# Patient Record
Sex: Male | Born: 1970
Health system: Southern US, Community
[De-identification: ages and names within clinical notes are randomized; demographics above are authoritative.]

## PROBLEM LIST (undated history)

## (undated) DIAGNOSIS — I10 Essential (primary) hypertension: Secondary | ICD-10-CM

## (undated) DIAGNOSIS — E785 Hyperlipidemia, unspecified: Secondary | ICD-10-CM

## (undated) HISTORY — DX: Hyperlipidemia, unspecified: E78.5

## (undated) HISTORY — PX: EYE SURGERY: SHX253

## (undated) HISTORY — PX: VASECTOMY: SHX75

## (undated) HISTORY — DX: Essential (primary) hypertension: I10

---

## 2015-08-20 ENCOUNTER — Other Ambulatory Visit: Payer: Self-pay | Admitting: Family Medicine

## 2015-08-20 DIAGNOSIS — R1084 Generalized abdominal pain: Secondary | ICD-10-CM

## 2015-08-21 ENCOUNTER — Other Ambulatory Visit: Payer: Self-pay | Admitting: Family Medicine

## 2015-08-21 DIAGNOSIS — R1011 Right upper quadrant pain: Secondary | ICD-10-CM

## 2015-08-27 ENCOUNTER — Other Ambulatory Visit: Payer: Self-pay | Admitting: Family Medicine

## 2015-08-27 ENCOUNTER — Ambulatory Visit
Admission: RE | Admit: 2015-08-27 | Discharge: 2015-08-27 | Disposition: A | Discharge status: Home / Self Care | Payer: Self-pay | Source: Ambulatory Visit | Attending: Family Medicine | Admitting: Family Medicine

## 2015-08-27 ENCOUNTER — Ambulatory Visit
Admission: RE | Admit: 2015-08-27 | Discharge: 2015-08-27 | Disposition: A | Discharge status: Home / Self Care | Payer: Federal, State, Local not specified - PPO | Source: Ambulatory Visit | Attending: Family Medicine | Admitting: Family Medicine

## 2015-08-27 DIAGNOSIS — R1011 Right upper quadrant pain: Secondary | ICD-10-CM

## 2015-08-27 DIAGNOSIS — R109 Unspecified abdominal pain: Secondary | ICD-10-CM

## 2016-03-22 DIAGNOSIS — K080 Exfoliation of teeth due to systemic causes: Secondary | ICD-10-CM | POA: Diagnosis not present

## 2016-03-29 DIAGNOSIS — K080 Exfoliation of teeth due to systemic causes: Secondary | ICD-10-CM | POA: Diagnosis not present

## 2016-06-05 IMAGING — US US ABDOMEN COMPLETE
1 series · 14 of 25 positions shown · non-contrast
Comparison: None.

CLINICAL DATA: Right upper quadrant abdominal pain.

EXAM:
ABDOMEN ULTRASOUND COMPLETE

[Series 1: us abdomen complete · 0.30mm/px · 14 of 85 slices shown]
[im 1/85]
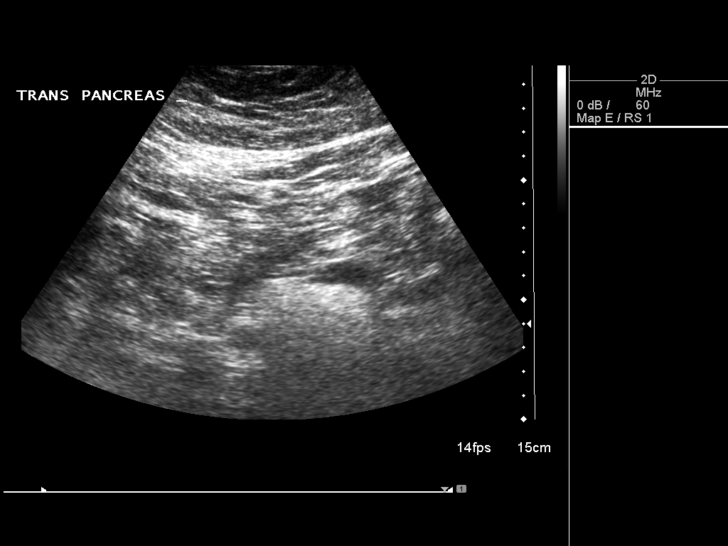
[im 8/85]
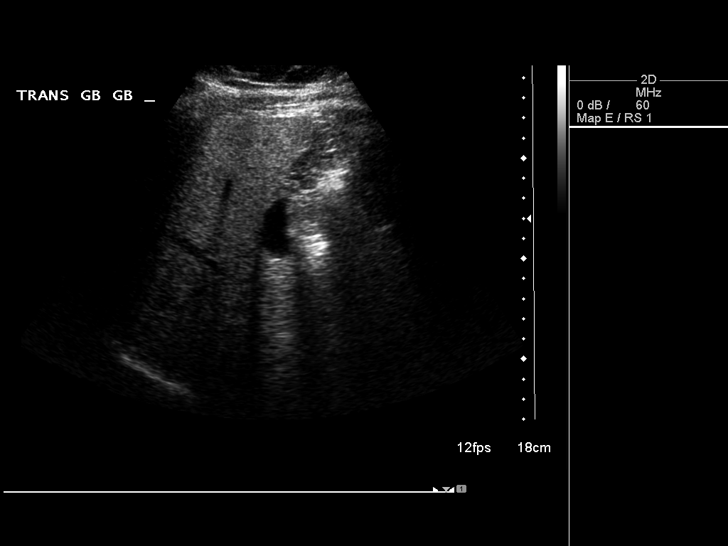
[im 15/85]
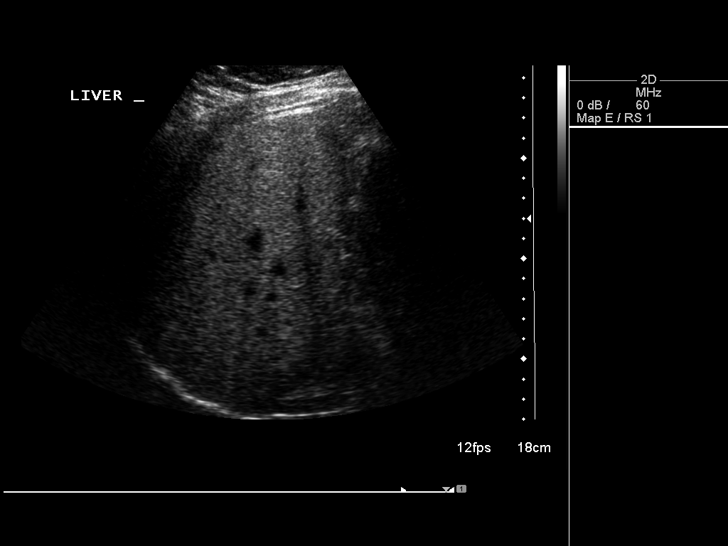
[im 22/85]
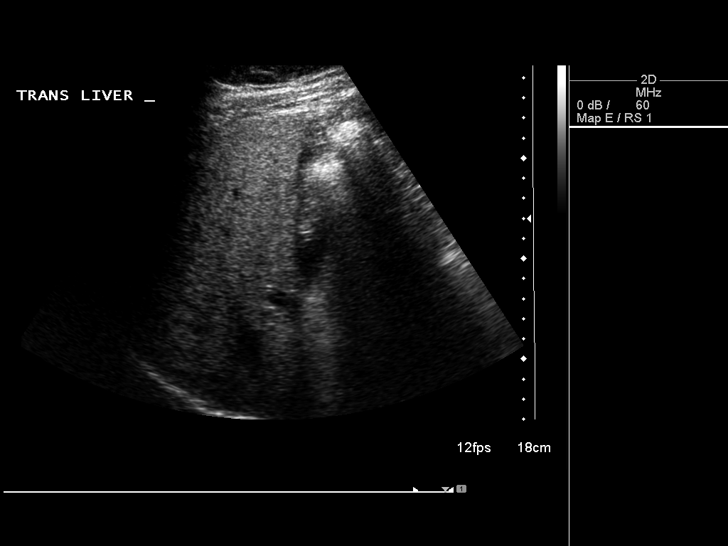
[im 29/85]
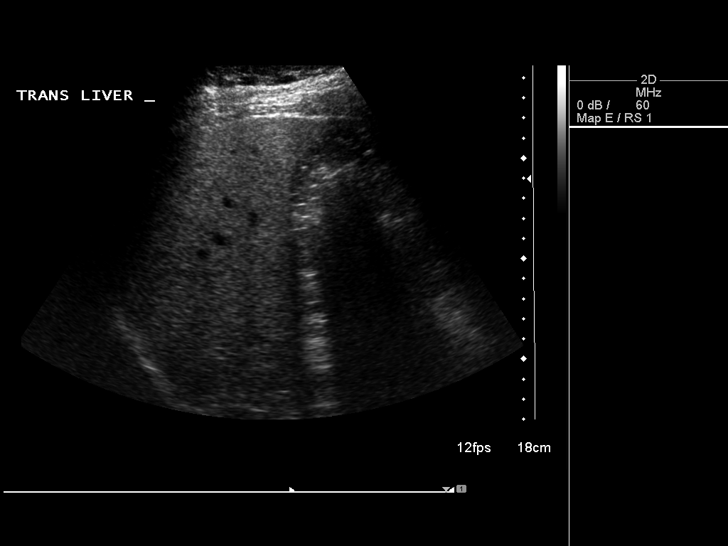
[im 32/85]
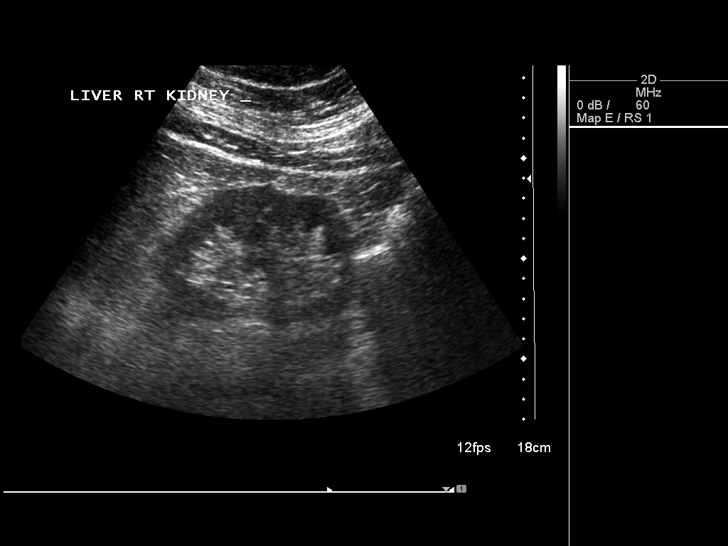
[im 39/85]
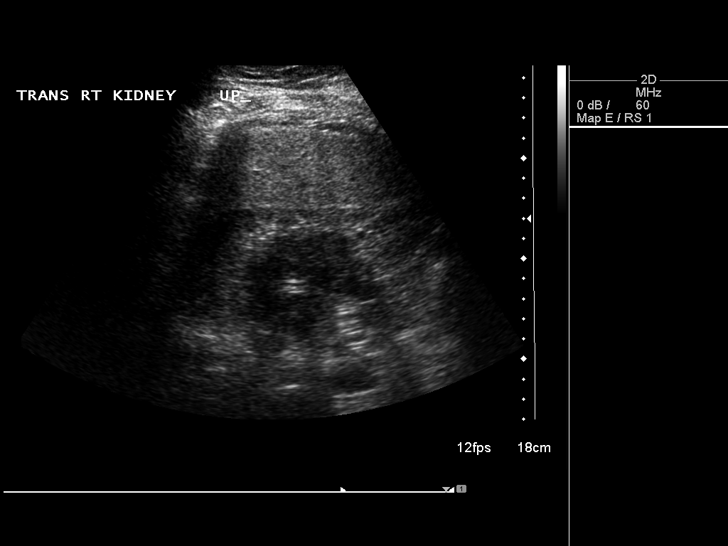
[im 46/85]
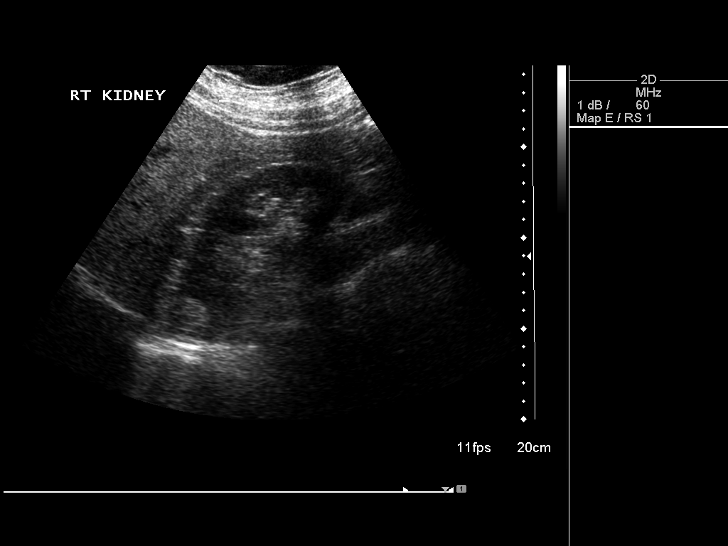
[im 53/85]
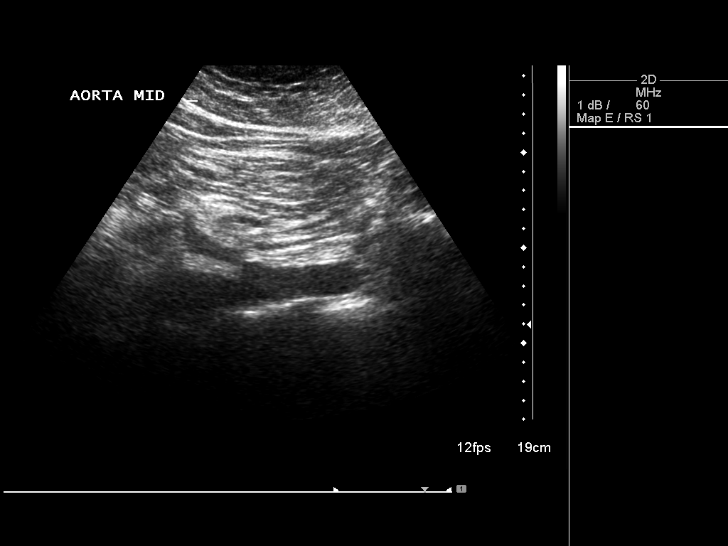
[im 57/85]
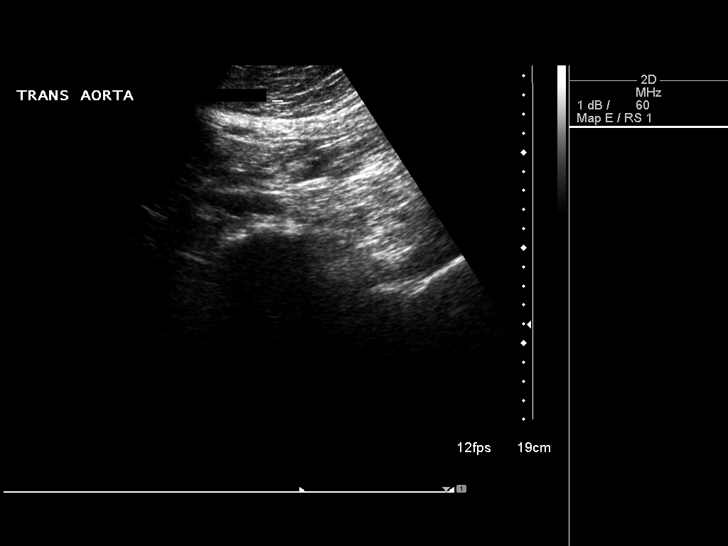
[im 64/85]
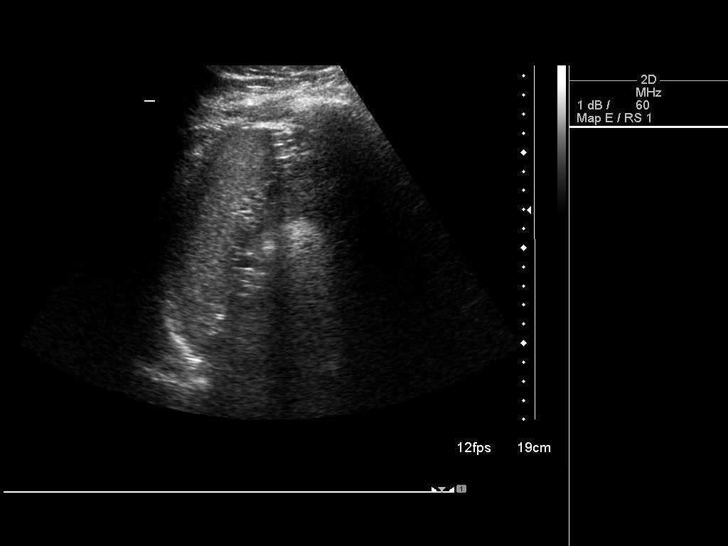
[im 71/85]
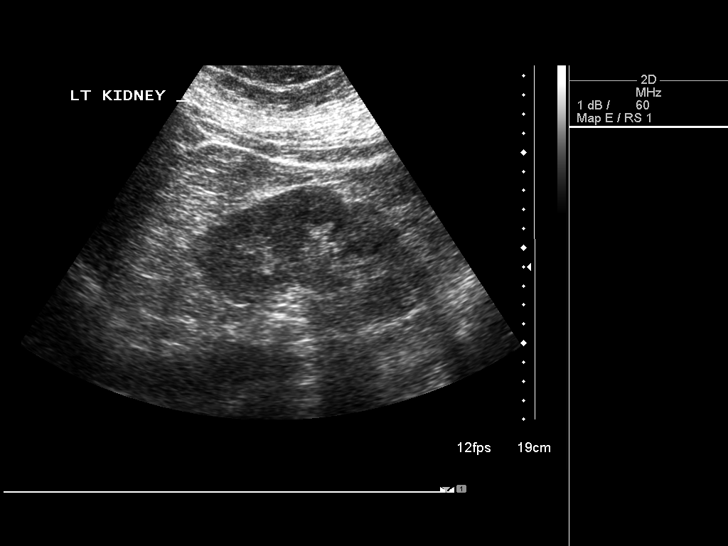
[im 78/85]
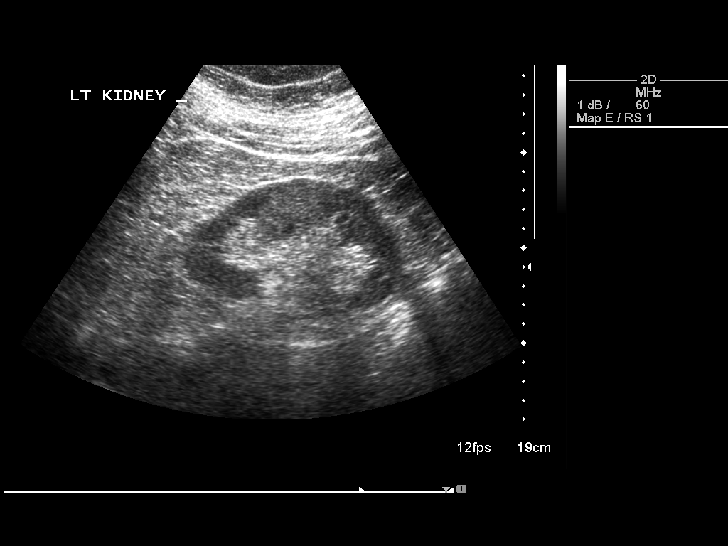
[im 85/85]
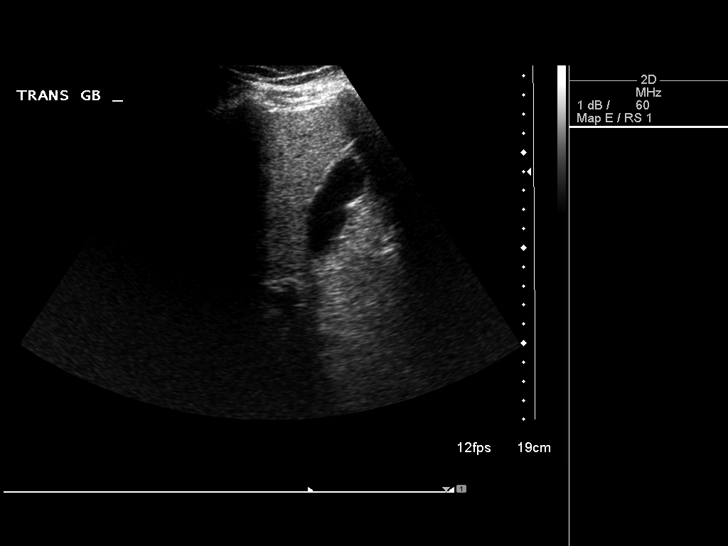

[14 of 25 positions shown; findings below may reference images not displayed]

FINDINGS: Gallbladder: No gallstones or wall thickening visualized. No
sonographic Murphy sign noted by sonographer.

Common bile duct: Diameter: 3 mm which is within normal limits.

Liver: No focal lesion identified. Within normal limits in
parenchymal echogenicity.

IVC: No abnormality visualized.

Pancreas: Visualized portion unremarkable.

Spleen: Size and appearance within normal limits.

Right Kidney: Length: 11.3 cm. Echogenicity within normal limits. No
mass or hydronephrosis visualized.

Left Kidney: Length: 12.2 cm. Echogenicity within normal limits. No
mass or hydronephrosis visualized.

Abdominal aorta: No aneurysm visualized.

Other findings: None.
IMPRESSION: No abnormality seen in the abdomen.

## 2016-07-27 DIAGNOSIS — E739 Lactose intolerance, unspecified: Secondary | ICD-10-CM | POA: Diagnosis not present

## 2016-07-27 DIAGNOSIS — E782 Mixed hyperlipidemia: Secondary | ICD-10-CM | POA: Diagnosis not present

## 2016-07-27 DIAGNOSIS — Z6834 Body mass index (BMI) 34.0-34.9, adult: Secondary | ICD-10-CM | POA: Diagnosis not present

## 2016-07-27 DIAGNOSIS — J309 Allergic rhinitis, unspecified: Secondary | ICD-10-CM | POA: Diagnosis not present

## 2016-09-20 DIAGNOSIS — K080 Exfoliation of teeth due to systemic causes: Secondary | ICD-10-CM | POA: Diagnosis not present

## 2017-02-07 DIAGNOSIS — Z114 Encounter for screening for human immunodeficiency virus [HIV]: Secondary | ICD-10-CM | POA: Diagnosis not present

## 2017-02-07 DIAGNOSIS — Z125 Encounter for screening for malignant neoplasm of prostate: Secondary | ICD-10-CM | POA: Diagnosis not present

## 2017-02-07 DIAGNOSIS — Z1329 Encounter for screening for other suspected endocrine disorder: Secondary | ICD-10-CM | POA: Diagnosis not present

## 2017-02-07 DIAGNOSIS — Z0000 Encounter for general adult medical examination without abnormal findings: Secondary | ICD-10-CM | POA: Diagnosis not present

## 2017-02-07 DIAGNOSIS — Z13220 Encounter for screening for lipoid disorders: Secondary | ICD-10-CM | POA: Diagnosis not present

## 2017-02-09 DIAGNOSIS — Z6834 Body mass index (BMI) 34.0-34.9, adult: Secondary | ICD-10-CM | POA: Diagnosis not present

## 2017-02-09 DIAGNOSIS — Z0000 Encounter for general adult medical examination without abnormal findings: Secondary | ICD-10-CM | POA: Diagnosis not present

## 2017-03-03 DIAGNOSIS — Z23 Encounter for immunization: Secondary | ICD-10-CM | POA: Diagnosis not present

## 2017-03-23 DIAGNOSIS — K080 Exfoliation of teeth due to systemic causes: Secondary | ICD-10-CM | POA: Diagnosis not present

## 2017-04-04 DIAGNOSIS — E782 Mixed hyperlipidemia: Secondary | ICD-10-CM | POA: Diagnosis not present

## 2017-04-04 DIAGNOSIS — Z6834 Body mass index (BMI) 34.0-34.9, adult: Secondary | ICD-10-CM | POA: Diagnosis not present

## 2017-04-04 DIAGNOSIS — I10 Essential (primary) hypertension: Secondary | ICD-10-CM | POA: Diagnosis not present

## 2017-04-25 DIAGNOSIS — I10 Essential (primary) hypertension: Secondary | ICD-10-CM | POA: Diagnosis not present

## 2017-08-08 DIAGNOSIS — E782 Mixed hyperlipidemia: Secondary | ICD-10-CM | POA: Diagnosis not present

## 2017-08-08 DIAGNOSIS — I10 Essential (primary) hypertension: Secondary | ICD-10-CM | POA: Diagnosis not present

## 2017-08-08 DIAGNOSIS — R945 Abnormal results of liver function studies: Secondary | ICD-10-CM | POA: Diagnosis not present

## 2017-08-10 DIAGNOSIS — I10 Essential (primary) hypertension: Secondary | ICD-10-CM | POA: Diagnosis not present

## 2017-08-10 DIAGNOSIS — E782 Mixed hyperlipidemia: Secondary | ICD-10-CM | POA: Diagnosis not present

## 2017-08-10 DIAGNOSIS — R945 Abnormal results of liver function studies: Secondary | ICD-10-CM | POA: Diagnosis not present

## 2017-08-10 DIAGNOSIS — L0202 Furuncle of face: Secondary | ICD-10-CM | POA: Diagnosis not present

## 2017-09-27 DIAGNOSIS — K080 Exfoliation of teeth due to systemic causes: Secondary | ICD-10-CM | POA: Diagnosis not present

## 2017-10-06 DIAGNOSIS — D179 Benign lipomatous neoplasm, unspecified: Secondary | ICD-10-CM | POA: Diagnosis not present

## 2017-10-06 DIAGNOSIS — Z6834 Body mass index (BMI) 34.0-34.9, adult: Secondary | ICD-10-CM | POA: Diagnosis not present

## 2018-02-14 DIAGNOSIS — Z125 Encounter for screening for malignant neoplasm of prostate: Secondary | ICD-10-CM | POA: Diagnosis not present

## 2018-02-14 DIAGNOSIS — Z0000 Encounter for general adult medical examination without abnormal findings: Secondary | ICD-10-CM | POA: Diagnosis not present

## 2018-02-14 DIAGNOSIS — Z1329 Encounter for screening for other suspected endocrine disorder: Secondary | ICD-10-CM | POA: Diagnosis not present

## 2018-02-14 DIAGNOSIS — Z114 Encounter for screening for human immunodeficiency virus [HIV]: Secondary | ICD-10-CM | POA: Diagnosis not present

## 2018-02-14 DIAGNOSIS — E782 Mixed hyperlipidemia: Secondary | ICD-10-CM | POA: Diagnosis not present

## 2018-02-16 DIAGNOSIS — I10 Essential (primary) hypertension: Secondary | ICD-10-CM | POA: Diagnosis not present

## 2018-02-16 DIAGNOSIS — L821 Other seborrheic keratosis: Secondary | ICD-10-CM | POA: Diagnosis not present

## 2018-02-16 DIAGNOSIS — Z0000 Encounter for general adult medical examination without abnormal findings: Secondary | ICD-10-CM | POA: Diagnosis not present

## 2018-02-16 DIAGNOSIS — D179 Benign lipomatous neoplasm, unspecified: Secondary | ICD-10-CM | POA: Diagnosis not present

## 2018-02-16 DIAGNOSIS — E782 Mixed hyperlipidemia: Secondary | ICD-10-CM | POA: Diagnosis not present

## 2018-02-16 DIAGNOSIS — Z23 Encounter for immunization: Secondary | ICD-10-CM | POA: Diagnosis not present

## 2018-03-09 DIAGNOSIS — D485 Neoplasm of uncertain behavior of skin: Secondary | ICD-10-CM | POA: Diagnosis not present

## 2018-03-09 DIAGNOSIS — D23121 Other benign neoplasm of skin of left upper eyelid, including canthus: Secondary | ICD-10-CM | POA: Diagnosis not present

## 2018-03-13 DIAGNOSIS — Z114 Encounter for screening for human immunodeficiency virus [HIV]: Secondary | ICD-10-CM | POA: Diagnosis not present

## 2018-03-13 DIAGNOSIS — Z0000 Encounter for general adult medical examination without abnormal findings: Secondary | ICD-10-CM | POA: Diagnosis not present

## 2018-03-13 DIAGNOSIS — Z1329 Encounter for screening for other suspected endocrine disorder: Secondary | ICD-10-CM | POA: Diagnosis not present

## 2018-04-03 DIAGNOSIS — K080 Exfoliation of teeth due to systemic causes: Secondary | ICD-10-CM | POA: Diagnosis not present

## 2018-04-25 DIAGNOSIS — H018 Other specified inflammations of eyelid: Secondary | ICD-10-CM | POA: Diagnosis not present

## 2018-04-25 DIAGNOSIS — D485 Neoplasm of uncertain behavior of skin: Secondary | ICD-10-CM | POA: Diagnosis not present

## 2018-05-10 DIAGNOSIS — K080 Exfoliation of teeth due to systemic causes: Secondary | ICD-10-CM | POA: Diagnosis not present

## 2018-06-19 DIAGNOSIS — Z6833 Body mass index (BMI) 33.0-33.9, adult: Secondary | ICD-10-CM | POA: Diagnosis not present

## 2018-06-19 DIAGNOSIS — I10 Essential (primary) hypertension: Secondary | ICD-10-CM | POA: Diagnosis not present

## 2018-12-18 DIAGNOSIS — I10 Essential (primary) hypertension: Secondary | ICD-10-CM | POA: Diagnosis not present

## 2019-01-08 DIAGNOSIS — I10 Essential (primary) hypertension: Secondary | ICD-10-CM | POA: Diagnosis not present

## 2019-01-08 DIAGNOSIS — Z719 Counseling, unspecified: Secondary | ICD-10-CM | POA: Diagnosis not present

## 2019-01-08 DIAGNOSIS — M7542 Impingement syndrome of left shoulder: Secondary | ICD-10-CM | POA: Diagnosis not present

## 2019-02-21 ENCOUNTER — Other Ambulatory Visit: Payer: Self-pay | Admitting: Family Medicine

## 2019-02-21 ENCOUNTER — Other Ambulatory Visit: Payer: Self-pay

## 2019-02-21 DIAGNOSIS — R197 Diarrhea, unspecified: Secondary | ICD-10-CM | POA: Diagnosis not present

## 2019-02-21 DIAGNOSIS — Z20822 Contact with and (suspected) exposure to covid-19: Secondary | ICD-10-CM

## 2019-02-21 DIAGNOSIS — R221 Localized swelling, mass and lump, neck: Secondary | ICD-10-CM

## 2019-02-21 DIAGNOSIS — R6889 Other general symptoms and signs: Secondary | ICD-10-CM | POA: Diagnosis not present

## 2019-02-21 DIAGNOSIS — I10 Essential (primary) hypertension: Secondary | ICD-10-CM | POA: Diagnosis not present

## 2019-02-21 DIAGNOSIS — M7542 Impingement syndrome of left shoulder: Secondary | ICD-10-CM | POA: Diagnosis not present

## 2019-02-22 ENCOUNTER — Ambulatory Visit
Admission: RE | Admit: 2019-02-22 | Discharge: 2019-02-22 | Disposition: A | Discharge status: Home / Self Care | Payer: Federal, State, Local not specified - PPO | Source: Ambulatory Visit | Attending: Family Medicine | Admitting: Family Medicine

## 2019-02-22 DIAGNOSIS — R221 Localized swelling, mass and lump, neck: Secondary | ICD-10-CM

## 2019-02-23 LAB — NOVEL CORONAVIRUS, NAA: SARS-CoV-2, NAA: NOT DETECTED

## 2019-02-26 DIAGNOSIS — D179 Benign lipomatous neoplasm, unspecified: Secondary | ICD-10-CM | POA: Diagnosis not present

## 2019-02-26 DIAGNOSIS — R197 Diarrhea, unspecified: Secondary | ICD-10-CM | POA: Diagnosis not present

## 2019-02-26 DIAGNOSIS — Z23 Encounter for immunization: Secondary | ICD-10-CM | POA: Diagnosis not present

## 2019-03-07 DIAGNOSIS — Z23 Encounter for immunization: Secondary | ICD-10-CM | POA: Diagnosis not present

## 2019-04-13 DIAGNOSIS — Z125 Encounter for screening for malignant neoplasm of prostate: Secondary | ICD-10-CM | POA: Diagnosis not present

## 2019-04-13 DIAGNOSIS — Z0000 Encounter for general adult medical examination without abnormal findings: Secondary | ICD-10-CM | POA: Diagnosis not present

## 2019-04-13 DIAGNOSIS — E782 Mixed hyperlipidemia: Secondary | ICD-10-CM | POA: Diagnosis not present

## 2019-04-13 LAB — LIPID PANEL
Cholesterol: 137 (ref 0–200)
HDL: 34 — AB (ref 35–70)
LDL Cholesterol: 73
Triglycerides: 175 — AB (ref 40–160)

## 2019-04-13 LAB — CBC AND DIFFERENTIAL: Hemoglobin: 16.4 (ref 13.5–17.5)

## 2019-04-13 LAB — BASIC METABOLIC PANEL: Creatinine: 0.9 (ref <=1.3)

## 2019-04-13 LAB — HEPATIC FUNCTION PANEL
ALT: 39 (ref 10–40)
AST: 31 (ref 14–40)
Alkaline Phosphatase: 76 (ref 25–125)

## 2019-04-17 DIAGNOSIS — Z6833 Body mass index (BMI) 33.0-33.9, adult: Secondary | ICD-10-CM | POA: Diagnosis not present

## 2019-04-17 DIAGNOSIS — Z0000 Encounter for general adult medical examination without abnormal findings: Secondary | ICD-10-CM | POA: Diagnosis not present

## 2019-04-17 DIAGNOSIS — Z1211 Encounter for screening for malignant neoplasm of colon: Secondary | ICD-10-CM | POA: Diagnosis not present

## 2019-10-16 DIAGNOSIS — H35033 Hypertensive retinopathy, bilateral: Secondary | ICD-10-CM | POA: Diagnosis not present

## 2019-12-02 IMAGING — US US SOFT TISSUE HEAD/NECK
1 series · 8 of 8 positions shown · non-contrast
Comparison: None.

CLINICAL DATA: Palpable left lateral neck/shoulder soft tissue mass

EXAM:
ULTRASOUND OF HEAD/NECK SOFT TISSUES
TECHNIQUE: Ultrasound examination of the head and neck soft tissues was
performed in the area of clinical concern.

[Series 1: us soft tissue head/neck · 0.06mm/px · 8 acquisitions, 8 frames shown]
[im 1/8]
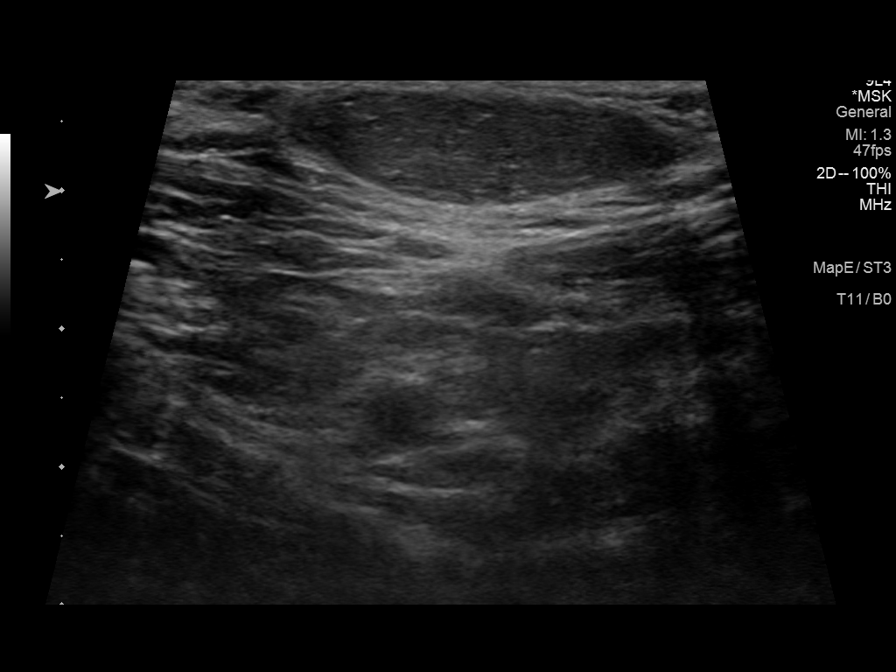
[im 2/8]
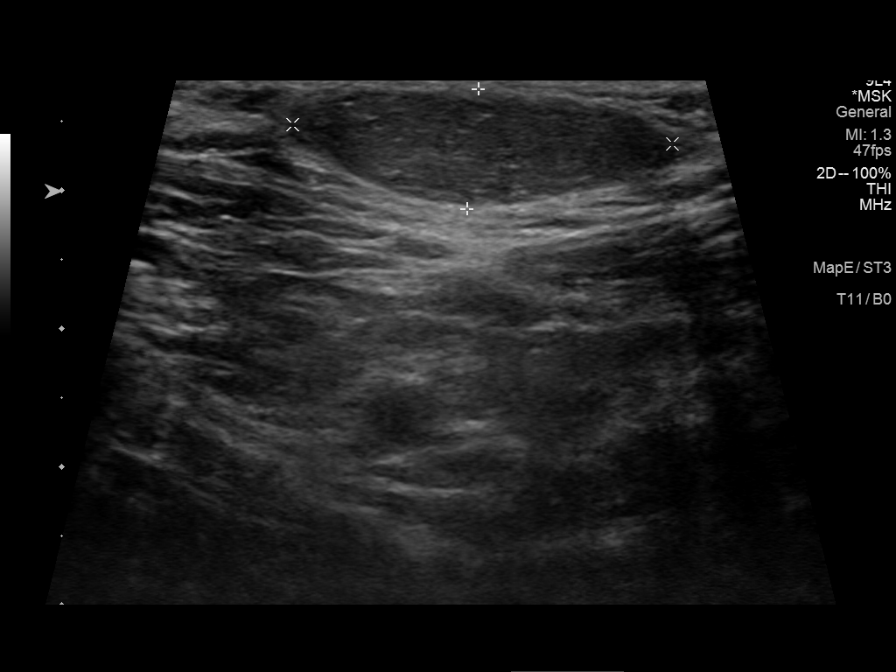
[im 3/8]
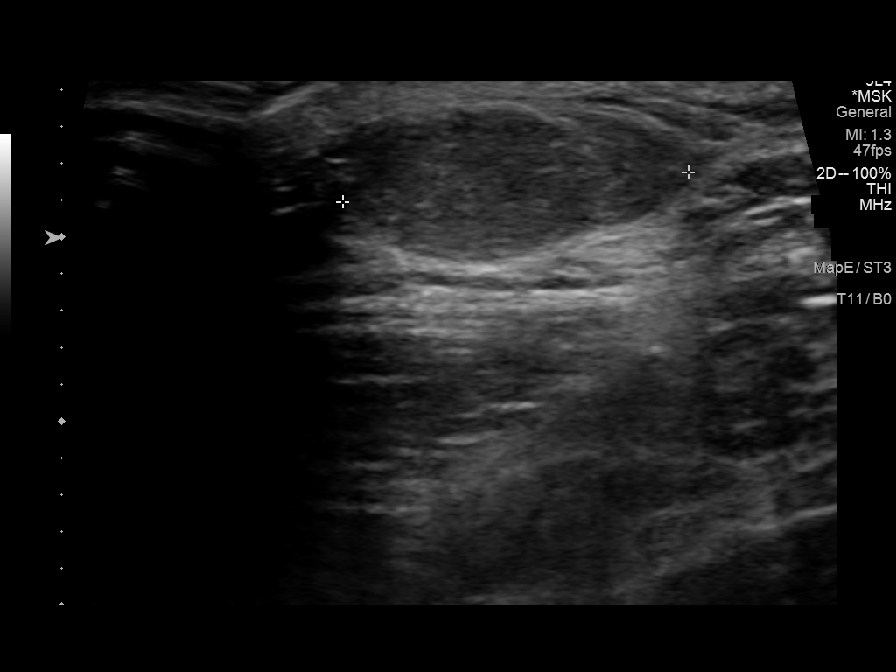
[im 4/8]
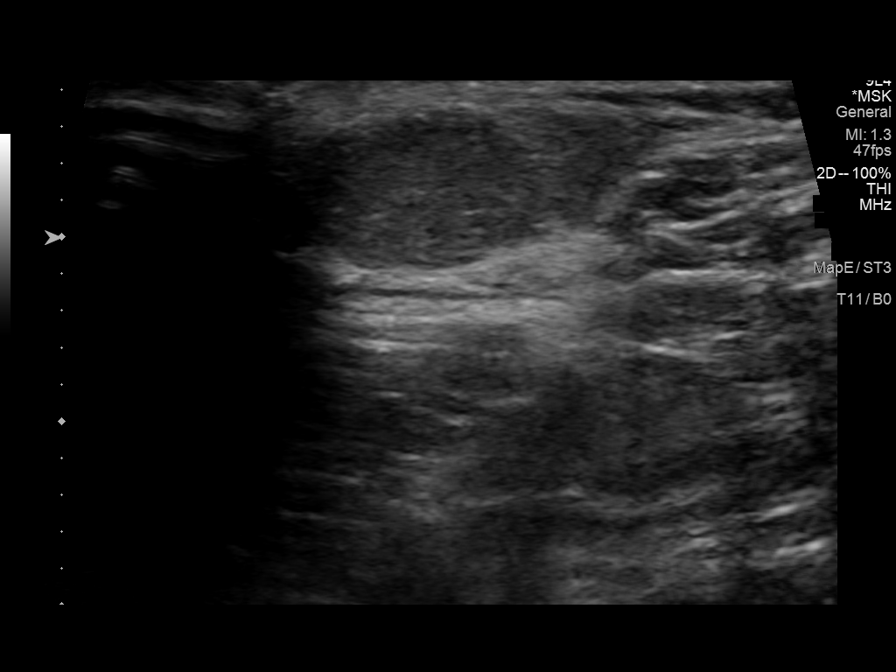
[im 5/8]
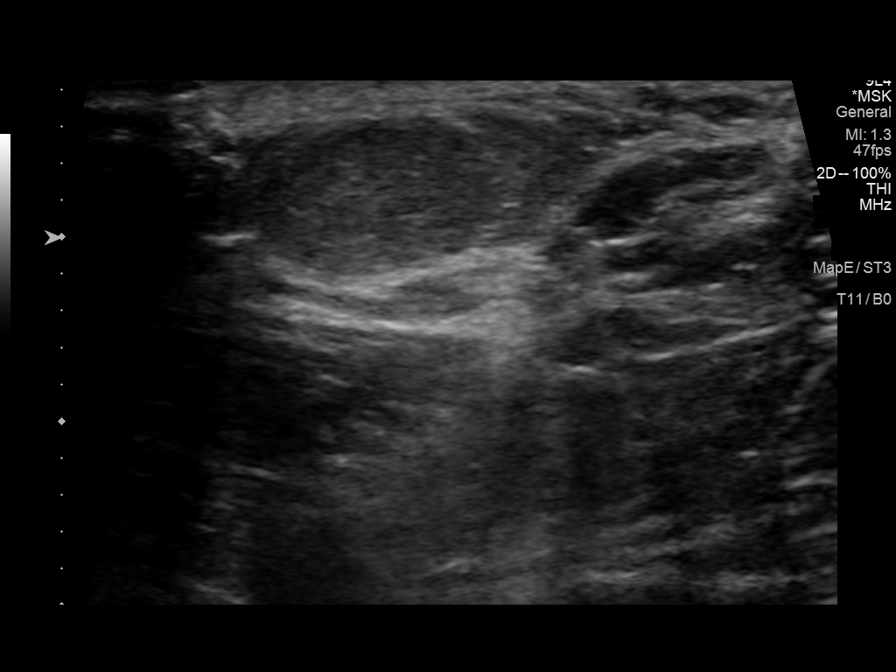
[im 6/8]
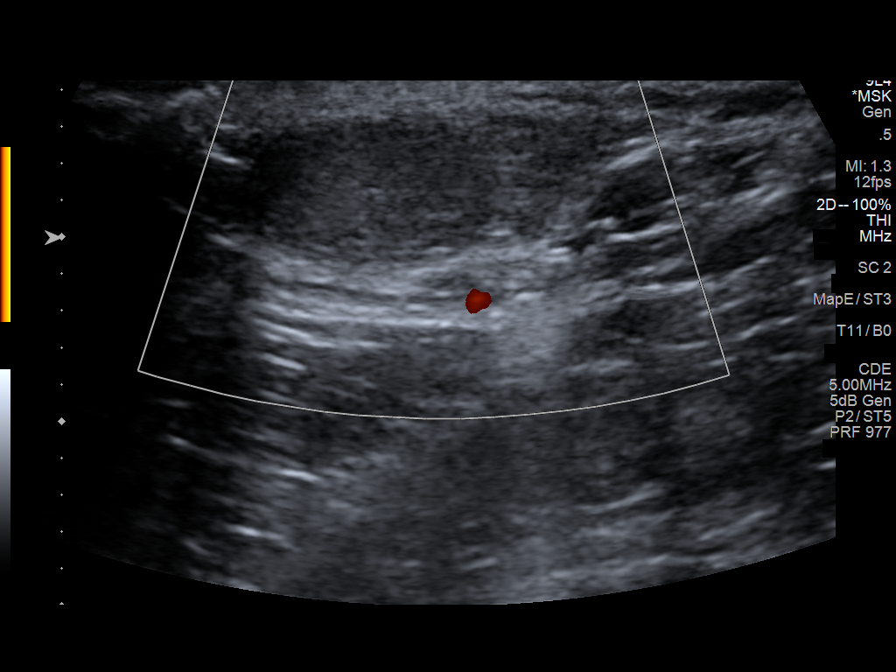
[im 7/8]
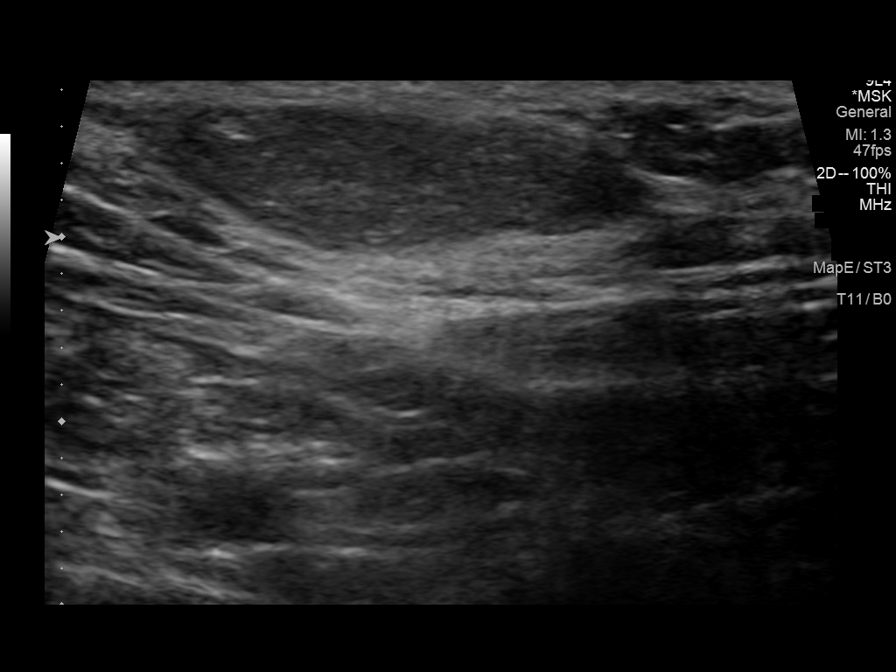
[im 8/8]
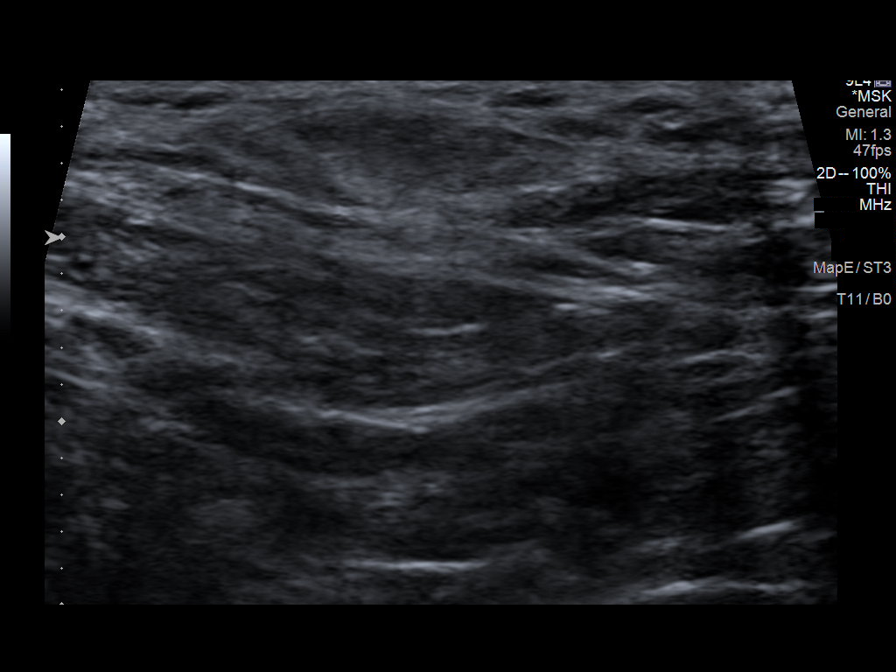

[8 of 8 positions shown; findings below may reference images not displayed]

FINDINGS: Soft tissue ultrasound performed of the left lateral neck/shoulder
area of concern. This correlates with a superficial oval hypoechoic
soft tissue mass measuring 1.9 x 2.8 x 0.9 cm. No associated
significant vascularity. These remain nonspecific by ultrasound but
favored to represent soft tissue lipoma.
IMPRESSION: Left lateral neck/shoulder superficial homogeneous hypoechoic soft
tissue mass. Lipoma is favored.

## 2019-12-21 DIAGNOSIS — H40023 Open angle with borderline findings, high risk, bilateral: Secondary | ICD-10-CM | POA: Diagnosis not present

## 2020-01-04 ENCOUNTER — Ambulatory Visit (INDEPENDENT_AMBULATORY_CARE_PROVIDER_SITE_OTHER): Payer: Federal, State, Local not specified - PPO | Admitting: Physician Assistant

## 2020-01-04 ENCOUNTER — Encounter: Payer: Self-pay | Admitting: Physician Assistant

## 2020-01-04 ENCOUNTER — Other Ambulatory Visit: Payer: Self-pay

## 2020-01-04 VITALS — BP 110/84 | HR 83 | Temp 98.1°F | Resp 16 | Ht 75.0 in | Wt 272.0 lb

## 2020-01-04 DIAGNOSIS — N50812 Left testicular pain: Secondary | ICD-10-CM | POA: Diagnosis not present

## 2020-01-04 DIAGNOSIS — E785 Hyperlipidemia, unspecified: Secondary | ICD-10-CM

## 2020-01-04 DIAGNOSIS — I10 Essential (primary) hypertension: Secondary | ICD-10-CM

## 2020-01-04 LAB — COMPREHENSIVE METABOLIC PANEL
ALT: 52 U/L (ref 0–53)
AST: 34 U/L (ref 0–37)
Albumin: 4.9 g/dL (ref 3.5–5.2)
Alkaline Phosphatase: 70 U/L (ref 39–117)
BUN: 15 mg/dL (ref 6–23)
CO2: 30 mEq/L (ref 19–32)
Calcium: 9.5 mg/dL (ref 8.4–10.5)
Chloride: 105 mEq/L (ref 96–112)
Creatinine, Ser: 1.02 mg/dL (ref 0.40–1.50)
GFR: 77.69 mL/min (ref >60.00)
Glucose, Bld: 96 mg/dL (ref 70–99)
Potassium: 3.7 mEq/L (ref 3.5–5.1)
Sodium: 143 mEq/L (ref 135–145)
Total Bilirubin: 0.6 mg/dL (ref 0.2–1.2)
Total Protein: 7.2 g/dL (ref 6.0–8.3)

## 2020-01-04 LAB — LIPID PANEL
Cholesterol: 139 mg/dL (ref 0–200)
HDL: 31.3 mg/dL — ABNORMAL LOW (ref >39.00)
LDL Cholesterol: 68 mg/dL (ref 0–99)
NonHDL: 107.55
Total CHOL/HDL Ratio: 4
Triglycerides: 198 mg/dL — ABNORMAL HIGH (ref 0.0–149.0)
VLDL: 39.6 mg/dL (ref 0.0–40.0)

## 2020-01-04 NOTE — Progress Notes (Signed)
Patient presents to clinic today to establish care. Patient is transferring care from Dr. Ernie Hew. Records requested. Endorses last CPE around 04/2019.   Acute Concerns: Intermittent L testicular pain x 1-2 years s/p vasectomy.  Denies any noted swelling of the area. Denies noted skin changes.  No symptoms of R testicle.  Denies change to urination.  Chronic Issues: Hypertension -- Patient is currently on a regimen of Benicar-HCT 40-12.5 mg daily. Endorses taking medications as directed. Patient denies chest pain, palpitations, lightheadedness, dizziness, vision changes or frequent headaches. Does walk a lot for work -- works at the [employer]. Lean proteins with vegetables and fruits. .   BP Readings from Last 3 Encounters:  01/04/20 110/84   Hyperlipidemia -- Patient is currently on Crestor 5 mg daily. Endorses taking medications as directed. Diet as noted previously.   Health Maintenance: Immunizations -- Will obtain records.  Colon Cancer Screening -- FOBT last year. Negative. Agrees to Cologuard testing.   Past Medical History:  Diagnosis Date  . Hyperlipidemia   . Hypertension     Current Outpatient Medications on File Prior to Visit  Medication Sig Dispense Refill  . clindamycin (CLEOCIN T) 1 % lotion Apply topically 2 (two) times daily as needed.    Marland Kitchen olmesartan-hydrochlorothiazide (BENICAR HCT) 40-12.5 MG tablet Take 1 tablet by mouth daily.    . rosuvastatin (CRESTOR) 5 MG tablet Take 5 mg by mouth at bedtime.     No current facility-administered medications on file prior to visit.    No Known Allergies  No family history on file.  Social History   Socioeconomic History  . Marital status: Married    Spouse name: Not on file  . Number of children: Not on file  . Years of education: Not on file  . Highest education level: Not on file  Occupational History  . Not on file  Tobacco Use  . Smoking status: Former Smoker    Types: Cigarettes    Quit date:  2007    Years since quitting: 14.5  . Smokeless tobacco: Never Used  Vaping Use  . Vaping Use: Never used  Substance and Sexual Activity  . Alcohol use: Yes    Alcohol/week: 2.0 standard drinks    Types: 2 Cans of beer per week  . Drug use: Not Currently  . Sexual activity: Yes  Other Topics Concern  . Not on file  Social History Narrative  . Not on file   Social Determinants of Health   Financial Resource Strain:   . Difficulty of Paying Living Expenses:   Food Insecurity:   . Worried About Charity fundraiser in the Last Year:   . Arboriculturist in the Last Year:   Transportation Needs:   . Film/video editor (Medical):   Marland Kitchen Lack of Transportation (Non-Medical):   Physical Activity:   . Days of Exercise per Week:   . Minutes of Exercise per Session:   Stress:   . Feeling of Stress :   Social Connections:   . Frequency of Communication with Friends and Family:   . Frequency of Social Gatherings with Friends and Family:   . Attends Religious Services:   . Active Member of Clubs or Organizations:   . Attends Archivist Meetings:   Marland Kitchen Marital Status:   Intimate Partner Violence:   . Fear of Current or Ex-Partner:   . Emotionally Abused:   Marland Kitchen Physically Abused:   . Sexually Abused:  ROS Pertinent ROS are listed in the HPI.   BP 110/84   Pulse 83   Temp 98.1 F (36.7 C) (Temporal)   Resp 16   Ht 6' 3" (1.905 m)   Wt (!) 272 lb (123.4 kg)   SpO2 98%   BMI 34.00 kg/m   Physical Exam Vitals reviewed.  Constitutional:      Appearance: Normal appearance.  HENT:     Head: Normocephalic and atraumatic.  Cardiovascular:     Rate and Rhythm: Normal rate and regular rhythm.  Abdominal:     Hernia: There is no hernia in the left inguinal area or right inguinal area.  Genitourinary:    Penis: Normal and circumcised. No tenderness or swelling.      Testes: Normal.        Right: Mass, tenderness or swelling not present.        Left: Mass, tenderness  or swelling not present.     Epididymis:     Right: Normal.     Left: Normal.  Musculoskeletal:     Cervical back: Neck supple.  Neurological:     Mental Status: He is alert.     Assessment/Plan: 1. Essential hypertension BP stable. Asymptomatic. Continue current regimen. Will check metabolic panel today.  - Comp Met (CMET)  2. Hyperlipidemia, unspecified hyperlipidemia type Taking statin as directed. Dietary and exercise recommendations reviewed. Will obtain fasting lipid and CMP today. - Comp Met (CMET) - Lipid panel  3. Left testicular pain Intermittent episodes since Vasectomy. No abnormalities on examination. Potentially pulling of scar tissue. Discussed potential for imaging to further assess. Patient would like to monitor for now.   This visit occurred during the SARS-CoV-2 public health emergency.  Safety protocols were in place, including screening questions prior to the visit, additional usage of staff PPE, and extensive cleaning of exam room while observing appropriate contact time as indicated for disinfecting solutions.     Leeanne Rio, PA-C

## 2020-01-04 NOTE — Patient Instructions (Signed)
Please keep up with current medication regimen.  Follow low-salt diet below.  Work up to a goal of 150 minutes of moderate-intensity aerobic exercise per week.  We will follow-up in November for your full physical. Sooner if needed.   Please go to the lab today for blood work.  I will call you with your results. We will alter treatment regimen(s) if indicated by your results.    DASH Eating Plan DASH stands for "Dietary Approaches to Stop Hypertension." The DASH eating plan is a healthy eating plan that has been shown to reduce high blood pressure (hypertension). It may also reduce your risk for type 2 diabetes, heart disease, and stroke. The DASH eating plan may also help with weight loss. What are tips for following this plan?  General guidelines  Avoid eating more than 2,300 mg (milligrams) of salt (sodium) a day. If you have hypertension, you may need to reduce your sodium intake to 1,500 mg a day.  Limit alcohol intake to no more than 1 drink a day for nonpregnant women and 2 drinks a day for men. One drink equals 12 oz of beer, 5 oz of wine, or 1 oz of hard liquor.  Work with your health care provider to maintain a healthy body weight or to lose weight. Ask what an ideal weight is for you.  Get at least 30 minutes of exercise that causes your heart to beat faster (aerobic exercise) most days of the week. Activities may include walking, swimming, or biking.  Work with your health care provider or diet and nutrition specialist (dietitian) to adjust your eating plan to your individual calorie needs. Reading food labels   Check food labels for the amount of sodium per serving. Choose foods with less than 5 percent of the Daily Value of sodium. Generally, foods with less than 300 mg of sodium per serving fit into this eating plan.  To find whole grains, look for the word "whole" as the first word in the ingredient list. Shopping  Buy products labeled as "low-sodium" or "no salt  added."  Buy fresh foods. Avoid canned foods and premade or frozen meals. Cooking  Avoid adding salt when cooking. Use salt-free seasonings or herbs instead of table salt or sea salt. Check with your health care provider or pharmacist before using salt substitutes.  Do not fry foods. Cook foods using healthy methods such as baking, boiling, grilling, and broiling instead.  Cook with heart-healthy oils, such as olive, canola, soybean, or sunflower oil. Meal planning  Eat a balanced diet that includes: ? 5 or more servings of fruits and vegetables each day. At each meal, try to fill half of your plate with fruits and vegetables. ? Up to 6-8 servings of whole grains each day. ? Less than 6 oz of lean meat, poultry, or fish each day. A 3-oz serving of meat is about the same size as a deck of cards. One egg equals 1 oz. ? 2 servings of low-fat dairy each day. ? A serving of nuts, seeds, or beans 5 times each week. ? Heart-healthy fats. Healthy fats called Omega-3 fatty acids are found in foods such as flaxseeds and coldwater fish, like sardines, salmon, and mackerel.  Limit how much you eat of the following: ? Canned or prepackaged foods. ? Food that is high in trans fat, such as fried foods. ? Food that is high in saturated fat, such as fatty meat. ? Sweets, desserts, sugary drinks, and other foods with added sugar. ?  Full-fat dairy products.  Do not salt foods before eating.  Try to eat at least 2 vegetarian meals each week.  Eat more home-cooked food and less restaurant, buffet, and fast food.  When eating at a restaurant, ask that your food be prepared with less salt or no salt, if possible. What foods are recommended? The items listed may not be a complete list. Talk with your dietitian about what dietary choices are best for you. Grains Whole-grain or whole-wheat bread. Whole-grain or whole-wheat pasta. Brown rice. [place]. Bulgur. Whole-grain and low-sodium cereals.  Pita bread. Low-fat, low-sodium crackers. Whole-wheat flour tortillas. Vegetables Fresh or frozen vegetables (raw, steamed, roasted, or grilled). Low-sodium or reduced-sodium tomato and vegetable juice. Low-sodium or reduced-sodium tomato sauce and tomato paste. Low-sodium or reduced-sodium canned vegetables. Fruits All fresh, dried, or frozen fruit. Canned fruit in natural juice (without added sugar). Meat and other protein foods Skinless chicken or [place]. Ground chicken or [place]. Pork with fat trimmed off. Fish and seafood. Egg whites. Dried beans, peas, or lentils. Unsalted nuts, nut butters, and seeds. Unsalted canned beans. Lean cuts of beef with fat trimmed off. Low-sodium, lean deli meat. Dairy Low-fat (1%) or fat-free (skim) milk. Fat-free, low-fat, or reduced-fat cheeses. Nonfat, low-sodium ricotta or cottage cheese. Low-fat or nonfat yogurt. Low-fat, low-sodium cheese. Fats and oils Soft margarine without trans fats. Vegetable oil. Low-fat, reduced-fat, or light mayonnaise and salad dressings (reduced-sodium). Canola, safflower, olive, soybean, and sunflower oils. Avocado. Seasoning and other foods Herbs. Spices. Seasoning mixes without salt. Unsalted popcorn and pretzels. Fat-free sweets. What foods are not recommended? The items listed may not be a complete list. Talk with your dietitian about what dietary choices are best for you. Grains Baked goods made with fat, such as croissants, muffins, or some breads. Dry pasta or rice meal packs. Vegetables Creamed or fried vegetables. Vegetables in a cheese sauce. Regular canned vegetables (not low-sodium or reduced-sodium). Regular canned tomato sauce and paste (not low-sodium or reduced-sodium). Regular tomato and vegetable juice (not low-sodium or reduced-sodium). Angie Fava. Olives. Fruits Canned fruit in a light or heavy syrup. Fried fruit. Fruit in cream or butter sauce. Meat and other protein foods Fatty cuts of meat. Ribs. Fried  meat. Berniece Salines. Sausage. Bologna and other processed lunch meats. Salami. Fatback. Hotdogs. Bratwurst. Salted nuts and seeds. Canned beans with added salt. Canned or smoked fish. Whole eggs or egg yolks. Chicken or [place] with skin. Dairy Whole or 2% milk, cream, and half-and-half. Whole or full-fat cream cheese. Whole-fat or sweetened yogurt. Full-fat cheese. Nondairy creamers. Whipped toppings. Processed cheese and cheese spreads. Fats and oils Butter. Stick margarine. Lard. Shortening. Ghee. Bacon fat. Tropical oils, such as coconut, palm kernel, or palm oil. Seasoning and other foods Salted popcorn and pretzels. Onion salt, garlic salt, seasoned salt, table salt, and sea salt. Worcestershire sauce. Tartar sauce. Barbecue sauce. Teriyaki sauce. Soy sauce, including reduced-sodium. Steak sauce. Canned and packaged gravies. Fish sauce. Oyster sauce. Cocktail sauce. Horseradish that you find on the shelf. Ketchup. Mustard. Meat flavorings and tenderizers. Bouillon cubes. Hot sauce and Tabasco sauce. Premade or packaged marinades. Premade or packaged taco seasonings. Relishes. Regular salad dressings. Where to find more information:  National Heart, Lung, and [place]: https://wilson-eaton.com/  American Heart Association: www.heart.org Summary  The DASH eating plan is a healthy eating plan that has been shown to reduce high blood pressure (hypertension). It may also reduce your risk for type 2 diabetes, heart disease, and stroke.  With the DASH eating plan, you  should limit salt (sodium) intake to 2,300 mg a day. If you have hypertension, you may need to reduce your sodium intake to 1,500 mg a day.  When on the DASH eating plan, aim to eat more fresh fruits and vegetables, whole grains, lean proteins, low-fat dairy, and heart-healthy fats.  Work with your health care provider or diet and nutrition specialist (dietitian) to adjust your eating plan to your individual calorie needs. This information is  not intended to replace advice given to you by your health care provider. Make sure you discuss any questions you have with your health care provider. Document Revised: 05/13/2017 Document Reviewed: 05/24/2016 Elsevier Patient Education  2020 [employer].

## 2020-01-09 ENCOUNTER — Encounter: Payer: Self-pay | Admitting: Emergency Medicine

## 2020-03-10 ENCOUNTER — Other Ambulatory Visit: Payer: Self-pay

## 2020-03-10 ENCOUNTER — Telehealth: Payer: Self-pay | Admitting: Physician Assistant

## 2020-03-10 MED ORDER — ROSUVASTATIN CALCIUM 5 MG PO TABS: 5.0000 mg | ORAL_TABLET | Freq: Every day | ORAL | 1 refills | Status: DC

## 2020-03-10 MED ORDER — OLMESARTAN MEDOXOMIL-HCTZ 40-12.5 MG PO TABS: 1.0000 | ORAL_TABLET | Freq: Every day | ORAL | 1 refills | Status: DC

## 2020-03-10 NOTE — Telephone Encounter (Signed)
Patient called about olmesartin and Rosuvastin - He needs both refilled - Please [facility] to [facility] in [place]

## 2020-03-10 NOTE — Telephone Encounter (Signed)
Medications sent to patients preferred pharmacy

## 2020-04-03 ENCOUNTER — Other Ambulatory Visit: Payer: Self-pay | Admitting: Physician Assistant

## 2020-04-23 ENCOUNTER — Encounter: Payer: Federal, State, Local not specified - PPO | Admitting: Physician Assistant

## 2020-04-26 ENCOUNTER — Other Ambulatory Visit: Payer: Self-pay | Admitting: Physician Assistant

## 2020-04-28 ENCOUNTER — Ambulatory Visit (INDEPENDENT_AMBULATORY_CARE_PROVIDER_SITE_OTHER): Payer: Federal, State, Local not specified - PPO | Admitting: Physician Assistant

## 2020-04-28 ENCOUNTER — Encounter: Payer: Self-pay | Admitting: Physician Assistant

## 2020-04-28 ENCOUNTER — Other Ambulatory Visit: Payer: Self-pay

## 2020-04-28 VITALS — BP 124/86 | HR 81 | Temp 98.4°F | Resp 16 | Ht 75.0 in | Wt 273.0 lb

## 2020-04-28 DIAGNOSIS — Z0000 Encounter for general adult medical examination without abnormal findings: Secondary | ICD-10-CM

## 2020-04-28 DIAGNOSIS — Z125 Encounter for screening for malignant neoplasm of prostate: Secondary | ICD-10-CM

## 2020-04-28 DIAGNOSIS — I10 Essential (primary) hypertension: Secondary | ICD-10-CM | POA: Diagnosis not present

## 2020-04-28 DIAGNOSIS — Z0001 Encounter for general adult medical examination with abnormal findings: Secondary | ICD-10-CM | POA: Diagnosis not present

## 2020-04-28 DIAGNOSIS — E785 Hyperlipidemia, unspecified: Secondary | ICD-10-CM

## 2020-04-28 DIAGNOSIS — Z1211 Encounter for screening for malignant neoplasm of colon: Secondary | ICD-10-CM

## 2020-04-28 DIAGNOSIS — M79645 Pain in left finger(s): Secondary | ICD-10-CM

## 2020-04-28 LAB — CBC WITH DIFFERENTIAL/PLATELET
Basophils Absolute: 0.1 10*3/uL (ref 0.0–0.1)
Basophils Relative: 0.9 % (ref 0.0–3.0)
Eosinophils Absolute: 0.1 10*3/uL (ref 0.0–0.7)
Eosinophils Relative: 1.7 % (ref 0.0–5.0)
HCT: 48.9 % (ref 39.0–52.0)
Hemoglobin: 17.1 g/dL — ABNORMAL HIGH (ref 13.0–17.0)
Lymphocytes Relative: 28.8 % (ref 12.0–46.0)
Lymphs Abs: 2.3 10*3/uL (ref 0.7–4.0)
MCHC: 35 g/dL (ref 30.0–36.0)
MCV: 92.4 fl (ref 78.0–100.0)
Monocytes Absolute: 0.5 10*3/uL (ref 0.1–1.0)
Monocytes Relative: 5.7 % (ref 3.0–12.0)
Neutro Abs: 5.1 10*3/uL (ref 1.4–7.7)
Neutrophils Relative %: 62.9 % (ref 43.0–77.0)
Platelets: 299 10*3/uL (ref 150.0–400.0)
RBC: 5.3 Mil/uL (ref 4.22–5.81)
RDW: 13.4 % (ref 11.5–15.5)
WBC: 8.1 10*3/uL (ref 4.0–10.5)

## 2020-04-28 LAB — LIPID PANEL
Cholesterol: 155 mg/dL (ref 0–200)
HDL: 31.6 mg/dL — ABNORMAL LOW (ref >39.00)
NonHDL: 122.97
Total CHOL/HDL Ratio: 5
Triglycerides: 219 mg/dL — ABNORMAL HIGH (ref 0.0–149.0)
VLDL: 43.8 mg/dL — ABNORMAL HIGH (ref 0.0–40.0)

## 2020-04-28 LAB — COMPREHENSIVE METABOLIC PANEL
ALT: 79 U/L — ABNORMAL HIGH (ref 0–53)
AST: 57 U/L — ABNORMAL HIGH (ref 0–37)
Albumin: 4.8 g/dL (ref 3.5–5.2)
Alkaline Phosphatase: 66 U/L (ref 39–117)
BUN: 12 mg/dL (ref 6–23)
CO2: 29 mEq/L (ref 19–32)
Calcium: 9.8 mg/dL (ref 8.4–10.5)
Chloride: 103 mEq/L (ref 96–112)
Creatinine, Ser: 0.88 mg/dL (ref 0.40–1.50)
GFR: 101.25 mL/min (ref >60.00)
Glucose, Bld: 69 mg/dL — ABNORMAL LOW (ref 70–99)
Potassium: 4 mEq/L (ref 3.5–5.1)
Sodium: 142 mEq/L (ref 135–145)
Total Bilirubin: 0.6 mg/dL (ref 0.2–1.2)
Total Protein: 7.3 g/dL (ref 6.0–8.3)

## 2020-04-28 LAB — PSA: PSA: 0.58 ng/mL (ref 0.10–4.00)

## 2020-04-28 LAB — HEMOGLOBIN A1C: Hgb A1c MFr Bld: 5.4 % (ref 4.6–6.5)

## 2020-04-28 LAB — LDL CHOLESTEROL, DIRECT: Direct LDL: 95 mg/dL

## 2020-04-28 MED ORDER — MELOXICAM 15 MG PO TABS: 15.0000 mg | ORAL_TABLET | Freq: Every day | ORAL | 0 refills | Status: DC

## 2020-04-28 NOTE — Patient Instructions (Signed)
Please go to the lab for blood work.   Our office will call you with your results unless you have chosen to receive results via MyChart.  If your blood work is normal we will follow-up each year for physicals and as scheduled for chronic medical problems.  If anything is abnormal we will treat accordingly and get you in for a follow-up.  Wear the brace as directed for the next 2 weeks, especially at night.  Take the meloxicam once daily with food. If not improving I will want you to see a hand specialist or we will at least need to get x-rays.   For the ears, use an anti-dandruff shampoo as discussed. Rinse well.  Apply peroxide to the ear with qtip once you are out. Then apply unscented, non-dyed moisturizer (Cerave or Cetaphil) to the area.  Let me know if this is not improving.    Preventive Care 33-43 Years Old, Male Preventive care refers to lifestyle choices and visits with your health care provider that can promote health and wellness. This includes:  A yearly physical exam. This is also called an annual well check.  Regular dental and eye exams.  Immunizations.  Screening for certain conditions.  Healthy lifestyle choices, such as eating a healthy diet, getting regular exercise, not using drugs or products that contain nicotine and tobacco, and limiting alcohol use. What can I expect for my preventive care visit? Physical exam Your health care provider will check:  Height and weight. These may be used to calculate body mass index (BMI), which is a measurement that tells if you are at a healthy weight.  Heart rate and blood pressure.  Your skin for abnormal spots. Counseling Your health care provider may ask you questions about:  Alcohol, tobacco, and drug use.  Emotional well-being.  Home and relationship well-being.  Sexual activity.  Eating habits.  Work and work Statistician. What immunizations do I need?  Influenza (flu) vaccine  This is recommended  every year. Tetanus, diphtheria, and pertussis (Tdap) vaccine  You may need a Td booster every 10 years. Varicella (chickenpox) vaccine  You may need this vaccine if you have not already been vaccinated. Zoster (shingles) vaccine  You may need this after age 83. Measles, mumps, and rubella (MMR) vaccine  You may need at least one dose of MMR if you were born in 1957 or later. You may also need a second dose. Pneumococcal conjugate (PCV13) vaccine  You may need this if you have certain conditions and were not previously vaccinated. Pneumococcal polysaccharide (PPSV23) vaccine  You may need one or two doses if you smoke cigarettes or if you have certain conditions. Meningococcal conjugate (MenACWY) vaccine  You may need this if you have certain conditions. Hepatitis A vaccine  You may need this if you have certain conditions or if you travel or work in places where you may be exposed to hepatitis A. Hepatitis B vaccine  You may need this if you have certain conditions or if you travel or work in places where you may be exposed to hepatitis B. Haemophilus influenzae type b (Hib) vaccine  You may need this if you have certain risk factors. Human papillomavirus (HPV) vaccine  If recommended by your health care provider, you may need three doses over 6 months. You may receive vaccines as individual doses or as more than one vaccine together in one shot (combination vaccines). Talk with your health care provider about the risks and benefits of combination vaccines. What  tests do I need? Blood tests  Lipid and cholesterol levels. These may be checked every 5 years, or more frequently if you are over 37 years old.  Hepatitis C test.  Hepatitis B test. Screening  Lung cancer screening. You may have this screening every year starting at age 66 if you have a 30-pack-year history of smoking and currently smoke or have quit within the past 15 years.  Prostate cancer screening.  Recommendations will vary depending on your family history and other risks.  Colorectal cancer screening. All adults should have this screening starting at age 57 and continuing until age 80. Your health care provider may recommend screening at age 41 if you are at increased risk. You will have tests every 1-10 years, depending on your results and the type of screening test.  Diabetes screening. This is done by checking your blood sugar (glucose) after you have not eaten for a while (fasting). You may have this done every 1-3 years.  Sexually transmitted disease (STD) testing. Follow these instructions at home: Eating and drinking  Eat a diet that includes fresh fruits and vegetables, whole grains, lean protein, and low-fat dairy products.  Take vitamin and mineral supplements as recommended by your health care provider.  Do not drink alcohol if your health care provider tells you not to drink.  If you drink alcohol: ? Limit how much you have to 0-2 drinks a day. ? Be aware of how much alcohol is in your drink. In the [place] one drink equals one 12 oz bottle of beer (355 mL), one 5 oz glass of wine (148 mL), or one 1 oz glass of hard liquor (44 mL). Lifestyle  Take daily care of your teeth and gums.  Stay active. Exercise for at least 30 minutes on 5 or more days each week.  Do not use any products that contain nicotine or tobacco, such as cigarettes, e-cigarettes, and chewing tobacco. If you need help quitting, ask your health care provider.  If you are sexually active, practice safe sex. Use a condom or other form of protection to prevent STIs (sexually transmitted infections).  Talk with your health care provider about taking a low-dose aspirin every day starting at age 65. What's next?  Go to your health care provider once a year for a well check visit.  Ask your health care provider how often you should have your eyes and teeth checked.  Stay up to date on all vaccines. This  information is not intended to replace advice given to you by your health care provider. Make sure you discuss any questions you have with your health care provider. Document Revised: 05/25/2018 Document Reviewed: 05/25/2018 Elsevier Patient Education  2020 [employer].

## 2020-04-28 NOTE — Progress Notes (Signed)
Patient presents to clinic today for annual exam.  Patient is fasting for labs.  Acute Concerns:  Patient also endorsing pain of his left thumb at MCP joint over the past several months.  Denies trauma or injury.  Does note some pain with range of motion of thumb.  Denies joint stiffness, swelling or redness.  Denies numbness or tingling of extremity.  Chronic Issues: Hypertension -- Is currently on a regimen of Benicar-HCT 40-12.5 mg daily. Endorses taking as directed. Has moved to more of a low-sodium and pescetarian diet. Is trying to stay active. Eliminated alcohol from diet except for specialist occasions. Patient denies chest pain, palpitations, lightheadedness, dizziness, vision changes or frequent headaches.  BP Readings from Last 3 Encounters:  04/28/20 124/86  01/04/20 110/84   Hyperlipidemia -- Is currently on a regimen of Crestor 5 mg daily. Is taking as directed.   Health Maintenance: Immunizations -- Flu shot and tetanus up-to-date.  Colon Cancer Screening -- Average risk. Asymptomatic.   Past Medical History:  Diagnosis Date  . Hyperlipidemia   . Hypertension     Past Surgical History:  Procedure Laterality Date  . EYE SURGERY     skin tag of eye lid  . VASECTOMY     5 years ago    Current Outpatient Medications on File Prior to Visit  Medication Sig Dispense Refill  . clindamycin (CLEOCIN T) 1 % lotion Apply topically 2 (two) times daily as needed.    Marland Kitchen olmesartan-hydrochlorothiazide (BENICAR HCT) 40-12.5 MG tablet Take 1 tablet by mouth daily. Follow up in November for physical 30 tablet 1  . rosuvastatin (CRESTOR) 5 MG tablet Take 1 tablet (5 mg total) by mouth at bedtime. 30 tablet 1   No current facility-administered medications on file prior to visit.    No Known Allergies  Family History  Problem Relation Age of Onset  . Skin cancer Mother        non-melanoma  . Hypertension Mother   . Hypertension Father   . Diabetes Mellitus II Brother     . Memory loss Maternal Grandmother   . Cancer Paternal Grandmother        Was in her 32s. Unsure of what kind -- widespread.  . Prostate cancer Paternal Grandfather        Late in life  . Healthy Brother   . Healthy Brother     Social History   Socioeconomic History  . Marital status: Married    Spouse name: Not on file  . Number of children: Not on file  . Years of education: Not on file  . Highest education level: Not on file  Occupational History  . Not on file  Tobacco Use  . Smoking status: Former Smoker    Types: Cigarettes    Quit date: 2007    Years since quitting: 14.8  . Smokeless tobacco: Never Used  Vaping Use  . Vaping Use: Never used  Substance and Sexual Activity  . Alcohol use: Yes    Alcohol/week: 2.0 standard drinks    Types: 2 Cans of beer per week  . Drug use: Not Currently  . Sexual activity: Yes  Other Topics Concern  . Not on file  Social History Narrative  . Not on file   Social Determinants of Health   Financial Resource Strain:   . Difficulty of Paying Living Expenses: Not on file  Food Insecurity:   . Worried About Charity fundraiser in the Last Year: Not  on file  . Ran Out of Food in the Last Year: Not on file  Transportation Needs:   . Lack of Transportation (Medical): Not on file  . Lack of Transportation (Non-Medical): Not on file  Physical Activity:   . Days of Exercise per Week: Not on file  . Minutes of Exercise per Session: Not on file  Stress:   . Feeling of Stress : Not on file  Social Connections:   . Frequency of Communication with Friends and Family: Not on file  . Frequency of Social Gatherings with Friends and Family: Not on file  . Attends Religious Services: Not on file  . Active Member of Clubs or Organizations: Not on file  . Attends Archivist Meetings: Not on file  . Marital Status: Not on file  Intimate Partner Violence:   . Fear of Current or Ex-Partner: Not on file  . Emotionally Abused: Not  on file  . Physically Abused: Not on file  . Sexually Abused: Not on file   Review of Systems  Constitutional: Negative for fever and weight loss.  HENT: Negative for ear discharge, ear pain, hearing loss and tinnitus.   Eyes: Negative for blurred vision, double vision, photophobia and pain.  Respiratory: Negative for cough and shortness of breath.   Cardiovascular: Negative for chest pain and palpitations.  Gastrointestinal: Negative for abdominal pain, blood in stool, constipation, diarrhea, heartburn, melena, nausea and vomiting.  Genitourinary: Negative for dysuria, flank pain, frequency, hematuria and urgency.  Musculoskeletal: Positive for joint pain. Negative for falls.  Neurological: Negative for dizziness, loss of consciousness and headaches.  Endo/Heme/Allergies: Negative for environmental allergies.  Psychiatric/Behavioral: Negative for depression, hallucinations, substance abuse and suicidal ideas. The patient is not nervous/anxious and does not have insomnia.    BP 124/86   Pulse 81   Temp 98.4 F (36.9 C) (Temporal)   Resp 16   Ht 6\' 3"  (1.905 m)   Wt 273 lb (123.8 kg)   SpO2 97%   BMI 34.12 kg/m   Physical Exam Vitals reviewed.  Constitutional:      General: He is not in acute distress.    Appearance: He is well-developed. He is not diaphoretic.  HENT:     Head: Normocephalic and atraumatic.     Right Ear: Tympanic membrane, ear canal and external ear normal.     Left Ear: Tympanic membrane, ear canal and external ear normal.     Nose: Nose normal.     Mouth/Throat:     Pharynx: No posterior oropharyngeal erythema.  Eyes:     Conjunctiva/sclera: Conjunctivae normal.     Pupils: Pupils are equal, round, and reactive to light.  Neck:     Thyroid: No thyromegaly.  Cardiovascular:     Rate and Rhythm: Normal rate and regular rhythm.     Heart sounds: Normal heart sounds.  Pulmonary:     Effort: Pulmonary effort is normal. No respiratory distress.      Breath sounds: Normal breath sounds. No wheezing or rales.  Chest:     Chest wall: No tenderness.  Abdominal:     General: Bowel sounds are normal. There is no distension.     Palpations: Abdomen is soft. There is no mass.     Tenderness: There is no abdominal tenderness. There is no guarding or rebound.  Musculoskeletal:     Right hand: Tenderness (R 1st MCP) present. Normal range of motion. Normal strength. Normal sensation. There is no disruption of two-point discrimination.  Normal capillary refill. Normal pulse.     Cervical back: Neck supple.     Comments: + Finklestein testing.   Lymphadenopathy:     Cervical: No cervical adenopathy.  Skin:    General: Skin is warm and dry.     Findings: No rash.  Neurological:     Mental Status: He is alert and oriented to person, place, and time.     Cranial Nerves: No cranial nerve deficit.     Assessment/Plan: 1. Visit for preventive health examination Depression screen negative. Health Maintenance reviewed. Preventive schedule discussed and handout given in AVS. Will obtain fasting labs today.  - Comprehensive metabolic panel - CBC with Differential/Platelet - Hemoglobin A1c - Lipid panel  2. Colon cancer screening Average risk.  Asymptomatic.  Referral to gastroenterology placed for screening colonoscopy. - Ambulatory referral to Gastroenterology  3. Prostate cancer screening He indicates understanding of the limitations of this screening test and wishes to proceed with screening PSA testing.  - PSA  4. Essential hypertension BP normotensive.  Asymptomatic.  Continue current regimen.  Dietary and exercise recommendations reviewed with patient.  Repeat labs today. - Comprehensive metabolic panel  5. Hyperlipidemia, unspecified hyperlipidemia type Take medication as directed.  Repeat fasting labs today.  Will adjust medication according to results. - Hemoglobin A1c  6. Thumb pain, left Question mild de Quervain's giving  slightly positive Finkelstein test but also feel he has some substantial first MCP arthritis.  Rx meloxicam.  Thumb spica splint applied.  Follow-up scheduled.  If not improving would recommend x-rays and or referral to sports medicine. - meloxicam (MOBIC) 15 MG tablet; Take 1 tablet (15 mg total) by mouth daily.  Dispense: 15 tablet; Refill: 0   This visit occurred during the SARS-CoV-2 public health emergency.  Safety protocols were in place, including screening questions prior to the visit, additional usage of staff PPE, and extensive cleaning of exam room while observing appropriate contact time as indicated for disinfecting solutions.    Leeanne Rio, PA-C

## 2020-04-28 NOTE — Telephone Encounter (Signed)
Patient has appointment today. Will fill at visit

## 2020-04-30 ENCOUNTER — Other Ambulatory Visit: Payer: Self-pay

## 2020-04-30 DIAGNOSIS — R945 Abnormal results of liver function studies: Secondary | ICD-10-CM

## 2020-04-30 DIAGNOSIS — R7989 Other specified abnormal findings of blood chemistry: Secondary | ICD-10-CM

## 2020-05-28 ENCOUNTER — Ambulatory Visit: Payer: Federal, State, Local not specified - PPO

## 2020-05-30 ENCOUNTER — Ambulatory Visit: Payer: Federal, State, Local not specified - PPO

## 2020-06-27 ENCOUNTER — Ambulatory Visit (INDEPENDENT_AMBULATORY_CARE_PROVIDER_SITE_OTHER): Payer: Federal, State, Local not specified - PPO | Admitting: *Deleted

## 2020-06-27 ENCOUNTER — Other Ambulatory Visit: Payer: Self-pay

## 2020-06-27 DIAGNOSIS — R945 Abnormal results of liver function studies: Secondary | ICD-10-CM

## 2020-06-27 DIAGNOSIS — R7989 Other specified abnormal findings of blood chemistry: Secondary | ICD-10-CM

## 2020-06-27 LAB — HEPATIC FUNCTION PANEL
ALT: 69 U/L — ABNORMAL HIGH (ref 0–53)
AST: 47 U/L — ABNORMAL HIGH (ref 0–37)
Albumin: 5 g/dL (ref 3.5–5.2)
Alkaline Phosphatase: 65 U/L (ref 39–117)
Bilirubin, Direct: 0.1 mg/dL (ref 0.0–0.3)
Total Bilirubin: 0.7 mg/dL (ref 0.2–1.2)
Total Protein: 7.3 g/dL (ref 6.0–8.3)

## 2020-06-27 NOTE — Progress Notes (Signed)
Pt here for lab work. Blood sample obtained successfully.

## 2020-07-07 ENCOUNTER — Encounter: Payer: Self-pay | Admitting: Physician Assistant

## 2020-07-07 ENCOUNTER — Other Ambulatory Visit: Payer: Self-pay | Admitting: Emergency Medicine

## 2020-07-07 DIAGNOSIS — R7989 Other specified abnormal findings of blood chemistry: Secondary | ICD-10-CM

## 2020-07-07 DIAGNOSIS — R945 Abnormal results of liver function studies: Secondary | ICD-10-CM

## 2020-07-16 ENCOUNTER — Other Ambulatory Visit: Payer: Self-pay

## 2020-07-16 ENCOUNTER — Ambulatory Visit (AMBULATORY_SURGERY_CENTER): Payer: Self-pay | Admitting: *Deleted

## 2020-07-16 VITALS — Ht 75.0 in | Wt 270.0 lb

## 2020-07-16 DIAGNOSIS — Z1211 Encounter for screening for malignant neoplasm of colon: Secondary | ICD-10-CM

## 2020-07-16 MED ORDER — NA SULFATE-K SULFATE-MG SULF 17.5-3.13-1.6 GM/177ML PO SOLN: ORAL | 0 refills | Status: DC

## 2020-07-16 NOTE — Progress Notes (Signed)
Patient is here in-person for PV. Patient denies any allergies to eggs or soy. Patient denies any problems with anesthesia/sedation. Patient denies any oxygen use at home. Patient denies taking any diet/weight loss medications or blood thinners. Patient is not being treated for MRSA or C-diff. Patient is aware of our care-partner policy and CXKGY-18 safety protocol. EMMI education assigned to the patient for the procedure, sent to [facility].   COVID-19 vaccines completed on 04/21/20 x3, per patient.   Prep Prescription coupon given to the patient.

## 2020-07-23 ENCOUNTER — Ambulatory Visit
Admission: RE | Admit: 2020-07-23 | Discharge: 2020-07-23 | Disposition: A | Discharge status: Home / Self Care | Payer: Federal, State, Local not specified - PPO | Source: Ambulatory Visit | Attending: Physician Assistant | Admitting: Physician Assistant

## 2020-07-23 DIAGNOSIS — R7989 Other specified abnormal findings of blood chemistry: Secondary | ICD-10-CM

## 2020-07-23 DIAGNOSIS — R945 Abnormal results of liver function studies: Secondary | ICD-10-CM

## 2020-07-23 DIAGNOSIS — K760 Fatty (change of) liver, not elsewhere classified: Secondary | ICD-10-CM | POA: Diagnosis not present

## 2020-07-30 ENCOUNTER — Encounter: Payer: Self-pay | Admitting: Gastroenterology

## 2020-07-30 ENCOUNTER — Ambulatory Visit (AMBULATORY_SURGERY_CENTER): Payer: Federal, State, Local not specified - PPO | Admitting: Gastroenterology

## 2020-07-30 ENCOUNTER — Other Ambulatory Visit: Payer: Self-pay

## 2020-07-30 VITALS — BP 132/89 | HR 70 | Temp 97.1°F | Resp 16 | Ht 75.0 in | Wt 270.0 lb

## 2020-07-30 DIAGNOSIS — D128 Benign neoplasm of rectum: Secondary | ICD-10-CM | POA: Diagnosis not present

## 2020-07-30 DIAGNOSIS — Z1211 Encounter for screening for malignant neoplasm of colon: Secondary | ICD-10-CM | POA: Diagnosis not present

## 2020-07-30 MED ORDER — SODIUM CHLORIDE 0.9 % IV SOLN: 500.0000 mL | Freq: Once | INTRAVENOUS | Status: DC

## 2020-07-30 NOTE — Op Note (Addendum)
[place] Patient Name: Cody Hunt Procedure Date: 07/30/2020 10:27 AM MRN: [id number] Endoscopist: Mallie Mussel L. Loletha Carrow , MD Age: 50 Referring MD:  Date of Birth: 02-06-1971 Gender: Male Account #: [id number] Procedure:                Colonoscopy Indications:              Screening for colorectal malignant neoplasm, This                            is the patient's first colonoscopy Medicines:                Monitored Anesthesia Care Procedure:                Pre-Anesthesia Assessment:                           - Prior to the procedure, a History and Physical                            was performed, and patient medications and                            allergies were reviewed. The patient's tolerance of                            previous anesthesia was also reviewed. The risks                            and benefits of the procedure and the sedation                            options and risks were discussed with the patient.                            All questions were answered, and informed consent                            was obtained. Prior Anticoagulants: The patient has                            taken no previous anticoagulant or antiplatelet                            agents. ASA Grade Assessment: II - A patient with                            mild systemic disease. After reviewing the risks                            and benefits, the patient was deemed in                            satisfactory condition to undergo the procedure.  After obtaining informed consent, the colonoscope                            was passed under direct vision. Throughout the                            procedure, the patient's blood pressure, pulse, and                            oxygen saturations were monitored continuously. The                            Colonoscope was introduced through the anus and                            advanced to the the cecum,  identified by                            appendiceal orifice and ileocecal valve. The                            colonoscopy was performed without difficulty. The                            patient tolerated the procedure well. The quality                            of the bowel preparation was excellent. The                            ileocecal valve, appendiceal orifice, and rectum                            were photographed. Scope In: 10:58:23 AM Scope Out: 11:13:32 AM Scope Withdrawal Time: 0 hours 12 minutes 37 seconds  Total Procedure Duration: 0 hours 15 minutes 9 seconds  Findings:                 The perianal and digital rectal examinations were                            normal.                           A diminutive polyp was found in the distal rectum.                            The polyp was sessile. The polyp was removed with a                            piecemeal technique using a cold biopsy forceps.                            Resection and retrieval were complete.  Multiple diverticula were found in the left colon.                           The exam was otherwise without abnormality on                            direct and retroflexion views. Complications:            No immediate complications. Estimated Blood Loss:     Estimated blood loss was minimal. Impression:               - One diminutive polyp in the distal rectum,                            removed piecemeal using a cold biopsy forceps.                            Resected and retrieved.                           - Diverticulosis in the left colon.                           - The examination was otherwise normal on direct                            and retroflexion views. Recommendation:           - Patient has a contact number available for                            emergencies. The signs and symptoms of potential                            delayed complications were discussed with  the                            patient. Return to normal activities tomorrow.                            Written discharge instructions were provided to the                            patient.                           - Resume previous diet.                           - Continue present medications.                           - Await pathology results.                           - Repeat colonoscopy is recommended for  surveillance. The colonoscopy date will be                            determined after pathology results from today's                            exam become available for review. Nadina Fomby L. Loletha Carrow, MD 07/30/2020 11:17:51 AM This report has been signed electronically.

## 2020-07-30 NOTE — Progress Notes (Signed)
PT taken to PACU. Monitors in place. VSS. Report given to RN. 

## 2020-07-30 NOTE — Patient Instructions (Signed)
YOU HAD AN ENDOSCOPIC PROCEDURE TODAY AT THE [facility]:   Refer to the procedure report that was given to you for any specific questions about what was found during the examination.  If the procedure report does not answer your questions, please call your gastroenterologist to clarify.  If you requested that your care partner not be given the details of your procedure findings, then the procedure report has been included in a sealed envelope for you to review at your convenience later.  YOU SHOULD EXPECT: Some feelings of bloating in the abdomen. Passage of more gas than usual.  Walking can help get rid of the air that was put into your GI tract during the procedure and reduce the bloating. If you had a lower endoscopy (such as a colonoscopy or flexible sigmoidoscopy) you may notice spotting of blood in your stool or on the toilet paper. If you underwent a bowel prep for your procedure, you may not have a normal bowel movement for a few days.  Please Note:  You might notice some irritation and congestion in your nose or some drainage.  This is from the oxygen used during your procedure.  There is no need for concern and it should clear up in a day or so.  SYMPTOMS TO REPORT IMMEDIATELY:   Following lower endoscopy (colonoscopy or flexible sigmoidoscopy):  Excessive amounts of blood in the stool  Significant tenderness or worsening of abdominal pains  Swelling of the abdomen that is new, acute  Fever of 100F or higher  For urgent or emergent issues, a gastroenterologist can be reached at any hour by calling [phone]. Do not use MyChart messaging for urgent concerns.    DIET:  We do recommend a small meal at first, but then you may proceed to your regular diet.  Drink plenty of fluids but you should avoid alcoholic beverages for 24 hours.  ACTIVITY:  You should plan to take it easy for the rest of today and you should NOT DRIVE or use heavy machinery until tomorrow (because  of the sedation medicines used during the test).    FOLLOW UP: Our staff will call the number listed on your records 48-72 hours following your procedure to check on you and address any questions or concerns that you may have regarding the information given to you following your procedure. If we do not reach you, we will leave a message.  We will attempt to reach you two times.  During this call, we will ask if you have developed any symptoms of COVID 19. If you develop any symptoms (ie: fever, flu-like symptoms, shortness of breath, cough etc.) before then, please call [phone].  If you test positive for Covid 19 in the 2 weeks post procedure, please call and report this information to us.    If any biopsies were taken you will be contacted by phone or by letter within the next 1-3 weeks.  Please call us at [phone] if you have not heard about the biopsies in 3 weeks.    SIGNATURES/CONFIDENTIALITY: You and/or your care partner have signed paperwork which will be entered into your electronic medical record.  These signatures attest to the fact that that the information above on your After Visit Summary has been reviewed and is understood.  Full responsibility of the confidentiality of this discharge information lies with you and/or your care-partner. 

## 2020-07-30 NOTE — Progress Notes (Signed)
Called to room to assist during endoscopic procedure.  Patient ID and intended procedure confirmed with present staff. Received instructions for my participation in the procedure from the performing physician.  

## 2020-07-30 NOTE — Progress Notes (Signed)
Pt's states no medical or surgical changes since previsit or office visit. 

## 2020-08-01 ENCOUNTER — Telehealth: Payer: Self-pay | Admitting: *Deleted

## 2020-08-01 NOTE — Telephone Encounter (Signed)
  Follow up Call-  Call back number 07/30/2020  Post procedure Call Back phone  # [phone]  Permission to leave phone message Yes  Some recent data might be hidden     Patient questions:  Do you have a fever, pain , or abdominal swelling? No. Pain Score  0 *  Have you tolerated food without any problems? Yes.    Have you been able to return to your normal activities? Yes.    Do you have any questions about your discharge instructions: Diet   No. Medications  No. Follow up visit  No.  Do you have questions or concerns about your Care? No.  Actions: * If pain score is 4 or above: No action needed, pain <4.  1. Have you developed a fever since your procedure? no  2.   Have you had an respiratory symptoms (SOB or cough) since your procedure? no  3.   Have you tested positive for COVID 19 since your procedure no  4.   Have you had any family members/close contacts diagnosed with the COVID 19 since your procedure?  no   If yes to any of these questions please route to Joylene John, RN and Joella Prince, RN

## 2020-08-05 ENCOUNTER — Encounter: Payer: Self-pay | Admitting: Gastroenterology

## 2020-09-03 DIAGNOSIS — K080 Exfoliation of teeth due to systemic causes: Secondary | ICD-10-CM | POA: Diagnosis not present

## 2020-09-11 ENCOUNTER — Other Ambulatory Visit: Payer: Self-pay

## 2020-09-11 ENCOUNTER — Ambulatory Visit (INDEPENDENT_AMBULATORY_CARE_PROVIDER_SITE_OTHER): Payer: Federal, State, Local not specified - PPO | Admitting: Family Medicine

## 2020-09-11 ENCOUNTER — Encounter: Payer: Self-pay | Admitting: Family Medicine

## 2020-09-11 VITALS — BP 114/81 | HR 70 | Temp 98.0°F | Ht 75.0 in | Wt 272.4 lb

## 2020-09-11 DIAGNOSIS — G8929 Other chronic pain: Secondary | ICD-10-CM

## 2020-09-11 DIAGNOSIS — I10 Essential (primary) hypertension: Secondary | ICD-10-CM

## 2020-09-11 DIAGNOSIS — K760 Fatty (change of) liver, not elsewhere classified: Secondary | ICD-10-CM | POA: Diagnosis not present

## 2020-09-11 DIAGNOSIS — E785 Hyperlipidemia, unspecified: Secondary | ICD-10-CM | POA: Diagnosis not present

## 2020-09-11 DIAGNOSIS — L739 Follicular disorder, unspecified: Secondary | ICD-10-CM | POA: Diagnosis not present

## 2020-09-11 DIAGNOSIS — M79645 Pain in left finger(s): Secondary | ICD-10-CM

## 2020-09-11 MED ORDER — OLMESARTAN MEDOXOMIL-HCTZ 40-12.5 MG PO TABS
1.0000 | ORAL_TABLET | Freq: Every day | ORAL | 3 refills | Status: DC
Start: 2020-09-11 — End: 2021-10-06

## 2020-09-11 MED ORDER — ROSUVASTATIN CALCIUM 5 MG PO TABS: 5.0000 mg | ORAL_TABLET | Freq: Every day | ORAL | 3 refills | Status: DC

## 2020-09-11 MED ORDER — CLINDAMYCIN PHOSPHATE 1 % EX LOTN: TOPICAL_LOTION | Freq: Two times a day (BID) | CUTANEOUS | 11 refills | Status: DC | PRN

## 2020-09-11 NOTE — Assessment & Plan Note (Signed)
Saw by his previous pcp for this a few months ago. Improving with splinting. Recommended OTC voltaren gel. Consider sports med and/or PT referral.

## 2020-09-11 NOTE — Progress Notes (Signed)
Cody Hunt is a 50 y.o. male who presents today for an office visit.He is transferring care to this office.   Assessment/Plan:  Chronic Problems Addressed Today: Chronic pain of left thumb Saw by his previous pcp for this a few months ago. Improving with splinting. Recommended OTC voltaren gel. Consider sports med and/or PT referral.  Folliculitis Stable.  Clindamycin lotion refilled.  Hepatic steatosis Discussed lifestyle modifications.  We can recheck LFTs when he comes back for CPE.  Hyperlipidemia Continue Crestor 5 mg daily.  Check lipids next blood draw.  Essential hypertension At goal.  Continue olmesartan-HCTZ 40-12.5mg  once daily.     Subjective:  HPI:  See a/p.  ROS: Per HPI, otherwise a complete review of systems was negative.   PMH:  The following were reviewed and entered/updated in epic: Past Medical History:  Diagnosis Date  . Hyperlipidemia   . Hypertension    Patient Active Problem List   Diagnosis Date Noted  . Hepatic steatosis 09/11/2020  . Folliculitis 58/59/2924  . Chronic pain of left thumb 09/11/2020  . Essential hypertension 01/04/2020  . Hyperlipidemia 01/04/2020   Past Surgical History:  Procedure Laterality Date  . EYE SURGERY     skin tag of eye lid  . VASECTOMY     5 years ago    Family History  Problem Relation Age of Onset  . Skin cancer Mother        non-melanoma  . Hypertension Mother   . Hypertension Father   . Colon polyps Father   . Diabetes Mellitus II Brother   . Memory loss Maternal Grandmother   . Cancer Paternal Grandmother        Was in her 18s. Unsure of what kind -- widespread.  . Prostate cancer Paternal Grandfather        Late in life  . Healthy Brother   . Healthy Brother   . Colon cancer Neg Hx   . Esophageal cancer Neg Hx   . Rectal cancer Neg Hx   . Stomach cancer Neg Hx     Medications- reviewed and updated Current Outpatient Medications  Medication Sig Dispense Refill  . FIBER PO  Take by mouth.    . Multiple Vitamin (MULTIVITAMIN) tablet Take 1 tablet by mouth daily.    . clindamycin (CLEOCIN T) 1 % lotion Apply topically 2 (two) times daily as needed. 60 mL 11  . olmesartan-hydrochlorothiazide (BENICAR HCT) 40-12.5 MG tablet Take 1 tablet by mouth daily. 90 tablet 3  . rosuvastatin (CRESTOR) 5 MG tablet Take 1 tablet (5 mg total) by mouth at bedtime. 90 tablet 3   No current facility-administered medications for this visit.    Allergies-reviewed and updated No Known Allergies  Social History   Socioeconomic History  . Marital status: Married    Spouse name: Not on file  . Number of children: Not on file  . Years of education: Not on file  . Highest education level: Not on file  Occupational History  . Not on file  Tobacco Use  . Smoking status: Former Smoker    Types: Cigarettes    Quit date: 2007    Years since quitting: 15.2  . Smokeless tobacco: Never Used  Vaping Use  . Vaping Use: Never used  Substance and Sexual Activity  . Alcohol use: Yes    Comment: 2-3 beers a month per pt  . Drug use: Not Currently  . Sexual activity: Yes  Other Topics Concern  . Not on file  Social History Narrative  . Not on file   Social Determinants of Health   Financial Resource Strain: Not on file  Food Insecurity: Not on file  Transportation Needs: Not on file  Physical Activity: Not on file  Stress: Not on file  Social Connections: Not on file         Objective:  Physical Exam: BP 114/81   Pulse 70   Temp 98 F (36.7 C) (Temporal)   Ht 6\' 3"  (1.905 m)   Wt 272 lb 6.4 oz (123.6 kg)   SpO2 99%   BMI 34.05 kg/m   Gen: No acute distress, resting comfortably CV: Regular rate and rhythm with no murmurs appreciated Pulm: Normal work of breathing, clear to auscultation bilaterally with no crackles, wheezes, or rhonchi Neuro: Grossly normal, moves all extremities Psych: Normal affect and thought content      Biance Moncrief M. Jerline Pain, MD 09/11/2020 2:48  PM

## 2020-09-11 NOTE — Assessment & Plan Note (Signed)
Continue Crestor 5 mg daily.  Check lipids next blood draw.

## 2020-09-11 NOTE — Assessment & Plan Note (Signed)
Discussed lifestyle modifications.  We can recheck LFTs when he comes back for CPE.

## 2020-09-11 NOTE — Assessment & Plan Note (Signed)
Stable.  Clindamycin lotion refilled.

## 2020-09-11 NOTE — Assessment & Plan Note (Signed)
At goal.  Continue olmesartan-HCTZ 40-12.5mg  once daily.

## 2020-09-11 NOTE — Patient Instructions (Signed)
It was very nice to see you today!  Please try using Voltaren for your thumb.  I will refill your medications today.  I will see you back in the fall for your annual physical with blood work.  Please come back to see me sooner if needed.  Take care, Dr Jerline Pain  PLEASE NOTE:  If you had any lab tests please let us know if you have not heard back within a few days. You may see your results on mychart before we have a chance to review them but we will give you a call once they are reviewed by [place]. If we ordered any referrals today, please let us know if you have not heard from their office within the next week.   Please try these tips to maintain a healthy lifestyle:   Eat at least 3 REAL meals and 1-2 snacks per day.  Aim for no more than 5 hours between eating.  If you eat breakfast, please do so within one hour of getting up.    Each meal should contain half fruits/vegetables, one quarter protein, and one quarter carbs (no bigger than a computer mouse)   Cut down on sweet beverages. This includes juice, soda, and sweet tea.     Drink at least 1 glass of water with each meal and aim for at least 8 glasses per day   Exercise at least 150 minutes every week.

## 2021-05-02 IMAGING — US US ABDOMEN LIMITED RUQ/ASCITES
1 series · 14 of 25 positions shown · non-contrast
Comparison: Abdominal ultrasound dated August 27, 2015.

CLINICAL DATA: Elevated LFTs.

EXAM:
ULTRASOUND ABDOMEN LIMITED RIGHT UPPER QUADRANT

[Series 1: us abdomen limited ruq/ascites · 0.25mm/px · 14 of 42 slices shown]
[im 1/42]
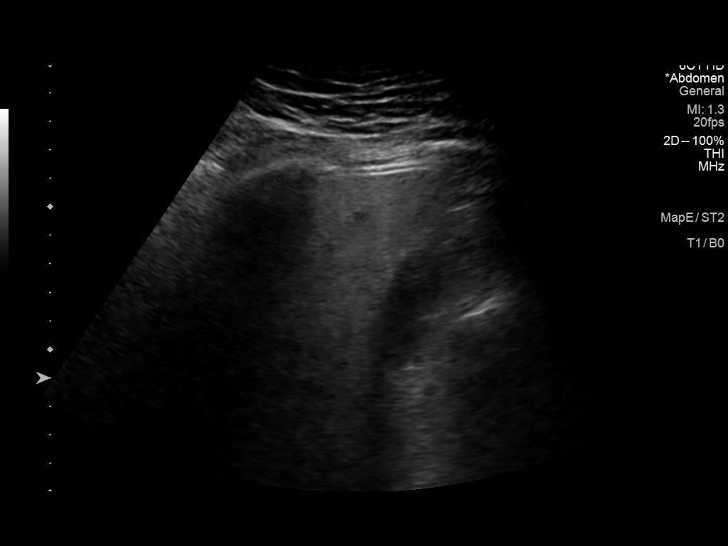
[im 4/42]
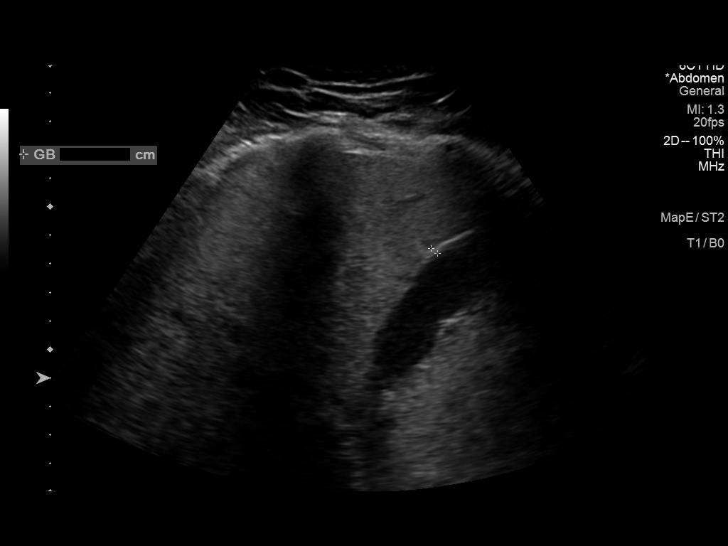
[im 7/42]
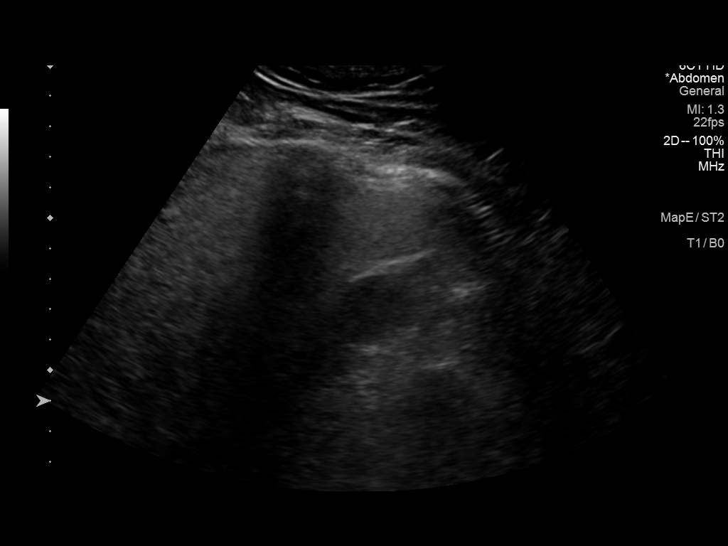
[im 11/42]
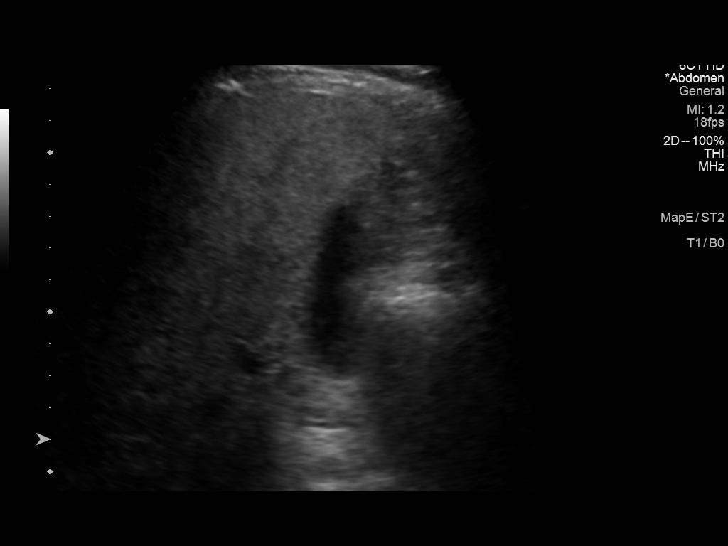
[im 14/42]
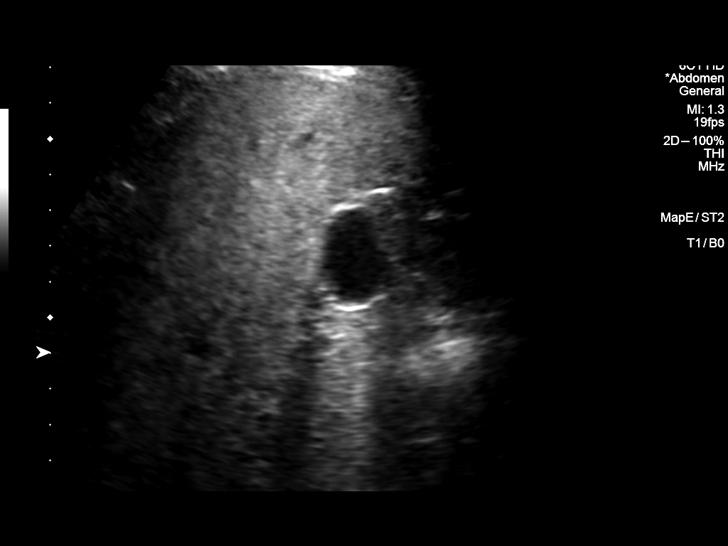
[im 16/42]
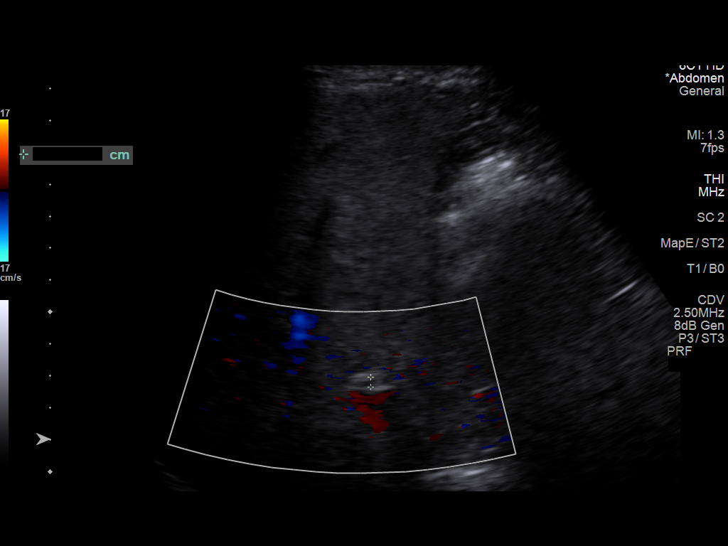
[im 19/42]
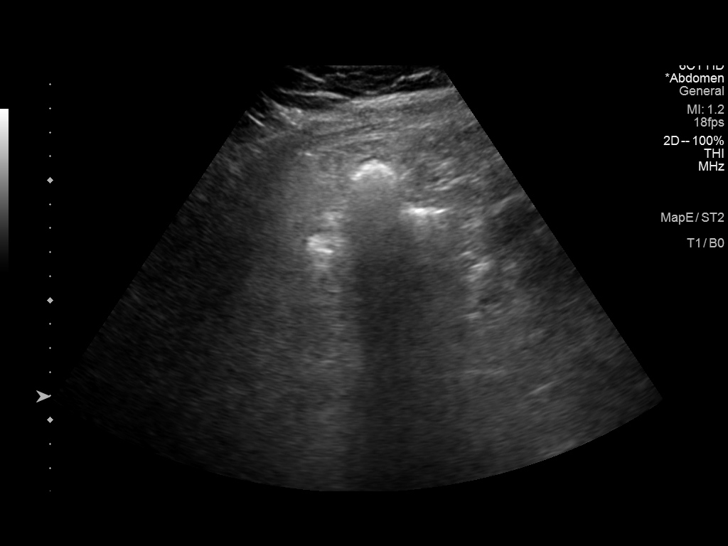
[im 23/42]
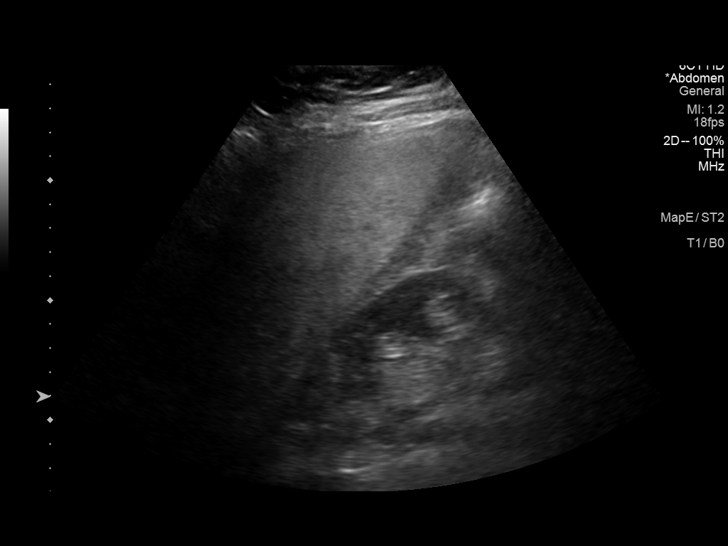
[im 26/42]
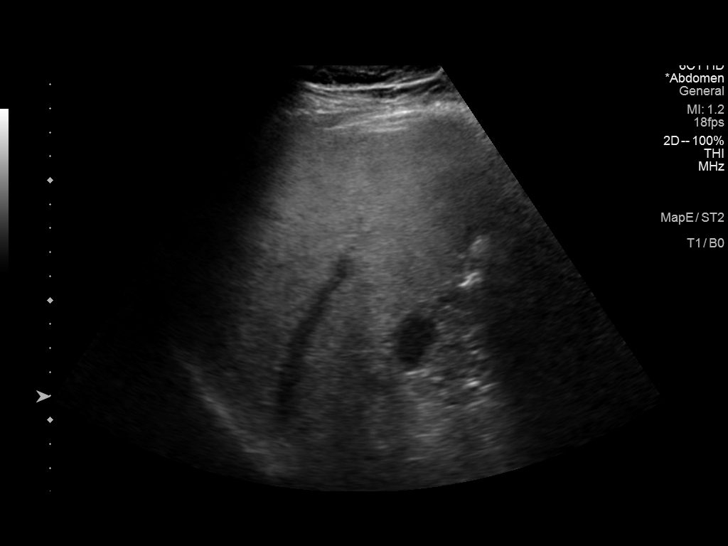
[im 28/42]
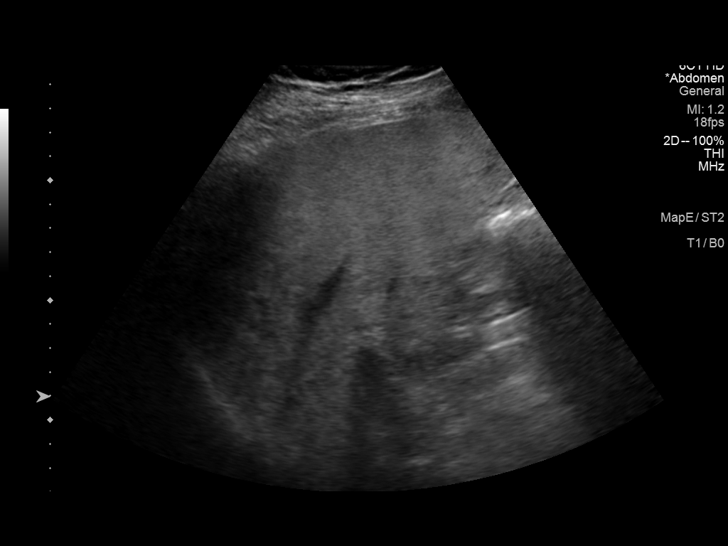
[im 31/42]
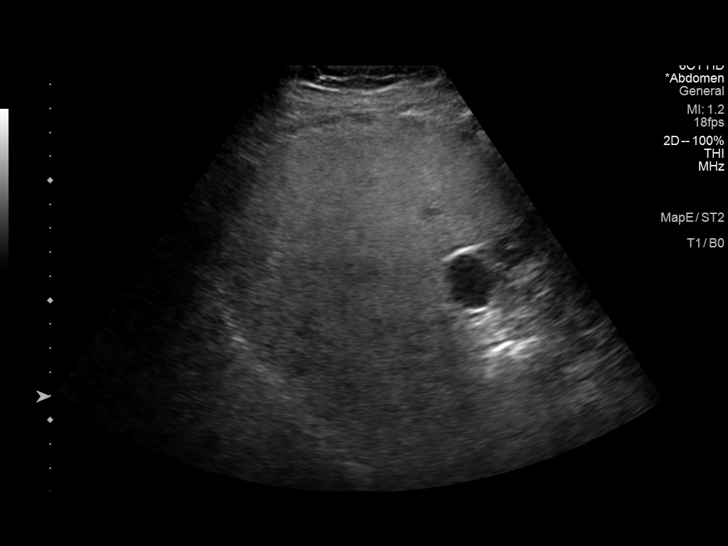
[im 35/42]
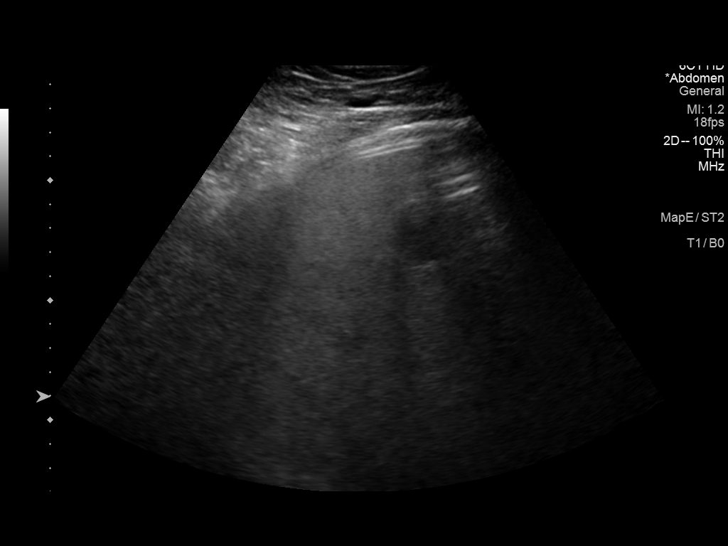
[im 38/42]
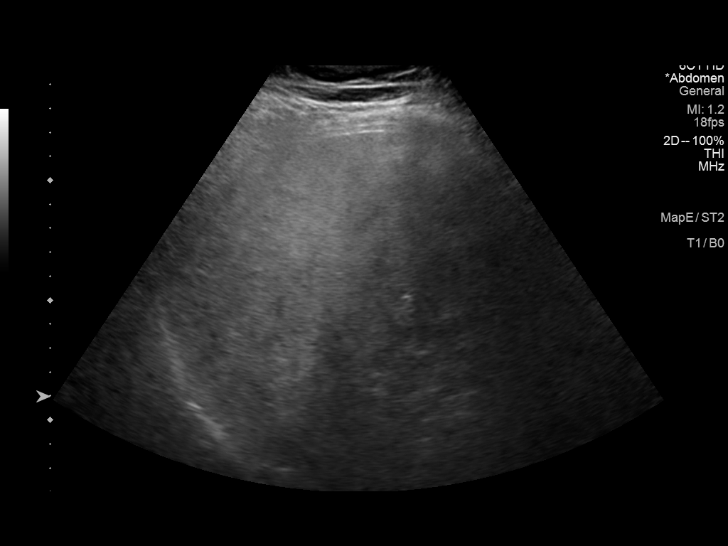
[im 42/42]
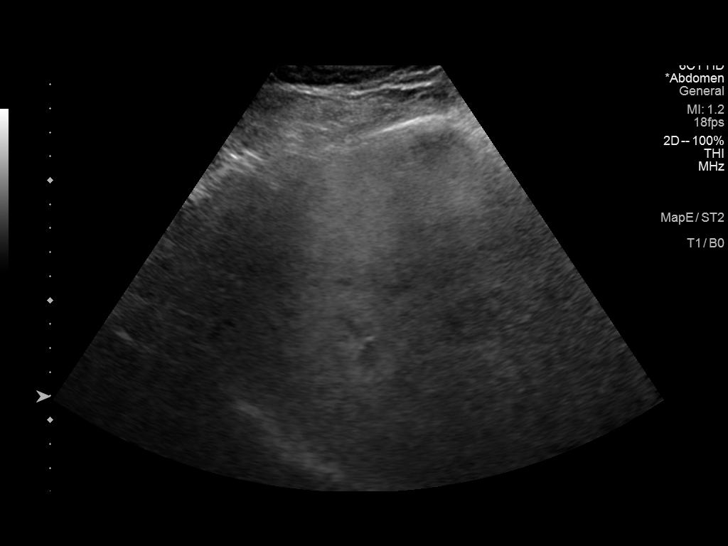

[14 of 25 positions shown; findings below may reference images not displayed]

FINDINGS: Gallbladder:

No gallstones or wall thickening visualized. No sonographic Murphy
sign noted by sonographer.

Common bile duct:

Diameter: 3 mm, normal.

Liver:

No focal lesion identified. Diffusely increased in parenchymal
echogenicity. Portal vein is patent on color Doppler imaging with
normal direction of blood flow towards the liver.

Other: None.
IMPRESSION: 1. No acute abnormality.
2. Hepatic steatosis.

## 2021-05-04 ENCOUNTER — Other Ambulatory Visit: Payer: Self-pay

## 2021-05-04 ENCOUNTER — Encounter: Payer: Self-pay | Admitting: Family Medicine

## 2021-05-04 ENCOUNTER — Ambulatory Visit (INDEPENDENT_AMBULATORY_CARE_PROVIDER_SITE_OTHER): Payer: Federal, State, Local not specified - PPO | Admitting: Family Medicine

## 2021-05-04 VITALS — BP 113/79 | HR 73 | Temp 97.2°F | Ht 75.0 in | Wt 282.2 lb

## 2021-05-04 DIAGNOSIS — I10 Essential (primary) hypertension: Secondary | ICD-10-CM | POA: Diagnosis not present

## 2021-05-04 DIAGNOSIS — E785 Hyperlipidemia, unspecified: Secondary | ICD-10-CM

## 2021-05-04 DIAGNOSIS — R739 Hyperglycemia, unspecified: Secondary | ICD-10-CM

## 2021-05-04 DIAGNOSIS — Z0001 Encounter for general adult medical examination with abnormal findings: Secondary | ICD-10-CM

## 2021-05-04 LAB — LIPID PANEL
Cholesterol: 188 mg/dL (ref 0–200)
HDL: 33.3 mg/dL — ABNORMAL LOW (ref >39.00)
NonHDL: 154.79
Total CHOL/HDL Ratio: 6
Triglycerides: 213 mg/dL — ABNORMAL HIGH (ref 0.0–149.0)
VLDL: 42.6 mg/dL — ABNORMAL HIGH (ref 0.0–40.0)

## 2021-05-04 LAB — COMPREHENSIVE METABOLIC PANEL
ALT: 79 U/L — ABNORMAL HIGH (ref 0–53)
AST: 58 U/L — ABNORMAL HIGH (ref 0–37)
Albumin: 4.7 g/dL (ref 3.5–5.2)
Alkaline Phosphatase: 71 U/L (ref 39–117)
BUN: 11 mg/dL (ref 6–23)
CO2: 29 mEq/L (ref 19–32)
Calcium: 9.6 mg/dL (ref 8.4–10.5)
Chloride: 105 mEq/L (ref 96–112)
Creatinine, Ser: 0.93 mg/dL (ref 0.40–1.50)
GFR: 96 mL/min (ref >60.00)
Glucose, Bld: 87 mg/dL (ref 70–99)
Potassium: 4.4 mEq/L (ref 3.5–5.1)
Sodium: 142 mEq/L (ref 135–145)
Total Bilirubin: 0.8 mg/dL (ref 0.2–1.2)
Total Protein: 7 g/dL (ref 6.0–8.3)

## 2021-05-04 LAB — CBC
HCT: 47.8 % (ref 39.0–52.0)
Hemoglobin: 16.5 g/dL (ref 13.0–17.0)
MCHC: 34.4 g/dL (ref 30.0–36.0)
MCV: 93.9 fl (ref 78.0–100.0)
Platelets: 252 10*3/uL (ref 150.0–400.0)
RBC: 5.09 Mil/uL (ref 4.22–5.81)
RDW: 13.8 % (ref 11.5–15.5)
WBC: 5.8 10*3/uL (ref 4.0–10.5)

## 2021-05-04 LAB — TSH: TSH: 3.53 u[IU]/mL (ref 0.35–5.50)

## 2021-05-04 LAB — HEMOGLOBIN A1C: Hgb A1c MFr Bld: 5.2 % (ref 4.6–6.5)

## 2021-05-04 LAB — LDL CHOLESTEROL, DIRECT: Direct LDL: 119 mg/dL

## 2021-05-04 MED ORDER — OZEMPIC (0.25 OR 0.5 MG/DOSE) 2 MG/1.5ML ~~LOC~~ SOPN: 0.2500 mg | PEN_INJECTOR | SUBCUTANEOUS | 1 refills | Status: DC

## 2021-05-04 MED ORDER — HYDROCORTISONE-ACETIC ACID 1-2 % OT SOLN: 3.0000 [drp] | Freq: Three times a day (TID) | OTIC | 0 refills | Status: DC

## 2021-05-04 NOTE — Patient Instructions (Signed)
It was very nice to see you today!  We will start Ozempic.  Please take 0.25 mg weekly for a few weeks.  We will probably bump you up to 0.5 mg weekly after 4 weeks of please send a MyChart message in a few weeks.  We will start the eardrops for your ears.  We will check blood work today.  I will see back in 1 year for your next physical.  Please come back sooner if needed.  Take care, Dr Jerline Pain  PLEASE NOTE:  If you had any lab tests please let us know if you have not heard back within a few days. You may see your results on mychart before we have a chance to review them but we will give you a call once they are reviewed by [place]. If we ordered any referrals today, please let us know if you have not heard from their office within the next week.   Please try these tips to maintain a healthy lifestyle:  Eat at least 3 REAL meals and 1-2 snacks per day.  Aim for no more than 5 hours between eating.  If you eat breakfast, please do so within one hour of getting up.   Each meal should contain half fruits/vegetables, one quarter protein, and one quarter carbs (no bigger than a computer mouse)  Cut down on sweet beverages. This includes juice, soda, and sweet tea.   Drink at least 1 glass of water with each meal and aim for at least 8 glasses per day  Exercise at least 150 minutes every week.    Preventive Care 34-64 Years Old, Male Preventive care refers to lifestyle choices and visits with your health care provider that can promote health and wellness. Preventive care visits are also called wellness exams. What can I expect for my preventive care visit? Counseling During your preventive care visit, your health care provider may ask about your: Medical history, including: Past medical problems. Family medical history. Current health, including: Emotional well-being. Home life and relationship well-being. Sexual activity. Lifestyle, including: Alcohol, nicotine or tobacco, and drug  use. Access to firearms. Diet, exercise, and sleep habits. Safety issues such as seatbelt and bike helmet use. Sunscreen use. Work and work Statistician. Physical exam Your health care provider will check your: Height and weight. These may be used to calculate your BMI (body mass index). BMI is a measurement that tells if you are at a healthy weight. Waist circumference. This measures the distance around your waistline. This measurement also tells if you are at a healthy weight and may help predict your risk of certain diseases, such as type 2 diabetes and high blood pressure. Heart rate and blood pressure. Body temperature. Skin for abnormal spots. What immunizations do I need? Vaccines are usually given at various ages, according to a schedule. Your health care provider will recommend vaccines for you based on your age, medical history, and lifestyle or other factors, such as travel or where you work. What tests do I need? Screening Your health care provider may recommend screening tests for certain conditions. This may include: Lipid and cholesterol levels. Diabetes screening. This is done by checking your blood sugar (glucose) after you have not eaten for a while (fasting). Hepatitis B test. Hepatitis C test. HIV (human immunodeficiency virus) test. STI (sexually transmitted infection) testing, if you are at risk. Lung cancer screening. Prostate cancer screening. Colorectal cancer screening. Talk with your health care provider about your test results, treatment options, and if  necessary, the need for more tests. Follow these instructions at home: Eating and drinking  Eat a diet that includes fresh fruits and vegetables, whole grains, lean protein, and low-fat dairy products. Take vitamin and mineral supplements as recommended by your health care provider. Do not drink alcohol if your health care provider tells you not to drink. If you drink alcohol: Limit how much you have to  0-2 drinks a day. Know how much alcohol is in your drink. In the [place] one drink equals one 12 oz bottle of beer (355 mL), one 5 oz glass of wine (148 mL), or one 1 oz glass of hard liquor (44 mL). Lifestyle Brush your teeth every morning and night with fluoride toothpaste. Floss one time each day. Exercise for at least 30 minutes 5 or more days each week. Do not use any products that contain nicotine or tobacco. These products include cigarettes, chewing tobacco, and vaping devices, such as e-cigarettes. If you need help quitting, ask your health care provider. Do not use drugs. If you are sexually active, practice safe sex. Use a condom or other form of protection to prevent STIs. Take aspirin only as told by your health care provider. Make sure that you understand how much to take and what form to take. Work with your health care provider to find out whether it is safe and beneficial for you to take aspirin daily. Find healthy ways to manage stress, such as: Meditation, yoga, or listening to music. Journaling. Talking to a trusted person. Spending time with friends and family. Minimize exposure to UV radiation to reduce your risk of skin cancer. Safety Always wear your seat belt while driving or riding in a vehicle. Do not drive: If you have been drinking alcohol. Do not ride with someone who has been drinking. When you are tired or distracted. While texting. If you have been using any mind-altering substances or drugs. Wear a helmet and other protective equipment during sports activities. If you have firearms in your house, make sure you follow all gun safety procedures. What's next? Go to your health care provider once a year for an annual wellness visit. Ask your health care provider how often you should have your eyes and teeth checked. Stay up to date on all vaccines. This information is not intended to replace advice given to you by your health care provider. Make sure you  discuss any questions you have with your health care provider. Document Revised: 11/26/2020 Document Reviewed: 11/26/2020 Elsevier Patient Education  [place].

## 2021-05-04 NOTE — Assessment & Plan Note (Signed)
Check labs.  He is on Crestor 5 mg daily.

## 2021-05-04 NOTE — Progress Notes (Signed)
Chief Complaint:  Cody Hunt is a 50 y.o. male who presents today for his annual comprehensive physical exam.    Assessment/Plan:  New/Acute Problems: EAc irritation Start acetic acid drops.  No red flags.  Chronic Problems Addressed Today: Morbid obesity ([place]) Discussed lifestyle modifications.  We will start Ozempic 0.25 mg weekly.  He will follow-up with me in a few weeks via MyChart.  Discussed potential side effects.  Hyperlipidemia Check labs.  He is on Crestor 5 mg daily.  Essential hypertension At goal on olmesartan-HCTZ 40-12.5 once daily.  Check labs.   Body mass index is 35.27 kg/m. / Obese.  BMI Metric Follow Up - 05/04/21 0821       BMI Metric Follow Up-Please document annually   BMI Metric Follow Up Education provided              Preventative Healthcare: Check Labs. UTD on colon cancer screening.  We will check with pharmacy about shingles vaccine.  Up-to-date on other vaccines.  Patient Counseling(The following topics were reviewed and/or handout was given):  -Nutrition: Stressed importance of moderation in sodium/caffeine intake, saturated fat and cholesterol, caloric balance, sufficient intake of fresh fruits, vegetables, and fiber.  -Stressed the importance of regular exercise.   -Substance Abuse: Discussed cessation/primary prevention of tobacco, alcohol, or other drug use; driving or other dangerous activities under the influence; availability of treatment for abuse.   -Injury prevention: Discussed safety belts, safety helmets, smoke detector, smoking near bedding or upholstery.   -Sexuality: Discussed sexually transmitted diseases, partner selection, use of condoms, avoidance of unintended pregnancy and contraceptive alternatives.   -Dental health: Discussed importance of regular tooth brushing, flossing, and dental visits.  -Health maintenance and immunizations reviewed. Please refer to Health maintenance section.  Return to care in 1 year  for next preventative visit.     Subjective:  HPI:  He has no acute complaints today.   He states that he has been having itchy ears for the past two years, which has worsened recently. He has started putting lotion on the areas to alleviate, but this only alleviates for around an hour.  He expresses an interest in acquiring a weight loss medication. He is compliant with all medication with no notable side effects.  Lifestyle Diet: Knows he can improve diet, can increase fruit and vegetable intake and reduce sugar/sodium intake.  Exercise: Recognizes he should exercise more, but work is in the way.  Depression screen [facility] 2/9 05/04/2021  Decreased Interest 0  Down, Depressed, Hopeless 0  PHQ - 2 Score 0  Altered sleeping -  Tired, decreased energy -  Change in appetite -  Feeling bad or failure about yourself  -  Trouble concentrating -  Moving slowly or fidgety/restless -  Suicidal thoughts -  PHQ-9 Score -  Difficult doing work/chores -    Health Maintenance Due  Topic Date Due   HIV Screening  Never done   Hepatitis C Screening  Never done   COVID-19 Vaccine (4 - Booster for Moderna series) 06/16/2020   Zoster Vaccines- Shingrix (1 of 2) Never done     ROS: Per HPI, otherwise a complete review of systems was negative.   PMH:  The following were reviewed and entered/updated in epic: Past Medical History:  Diagnosis Date   Hyperlipidemia    Hypertension    Patient Active Problem List   Diagnosis Date Noted   Morbid obesity ([place]) 05/04/2021   Hepatic steatosis 34/28/7681   Folliculitis 15/72/6203  Chronic pain of left thumb 09/11/2020   Essential hypertension 01/04/2020   Hyperlipidemia 01/04/2020   Past Surgical History:  Procedure Laterality Date   EYE SURGERY     skin tag of eye lid   VASECTOMY     5 years ago    Family History  Problem Relation Age of Onset   Skin cancer Mother        non-melanoma   Hypertension Mother    Hypertension Father     Colon polyps Father    Diabetes Mellitus II Brother    Memory loss Maternal Grandmother    Cancer Paternal Grandmother        Was in her 5s. Unsure of what kind -- widespread.   Prostate cancer Paternal Grandfather        Late in life   Healthy Brother    Healthy Brother    Colon cancer Neg Hx    Esophageal cancer Neg Hx    Rectal cancer Neg Hx    Stomach cancer Neg Hx     Medications- reviewed and updated Current Outpatient Medications  Medication Sig Dispense Refill   acetic acid-hydrocortisone (VOSOL-HC) OTIC solution Place 3 drops into both ears 3 (three) times daily. 10 mL 0   clindamycin (CLEOCIN T) 1 % lotion Apply topically 2 (two) times daily as needed. 60 mL 11   FIBER PO Take by mouth.     Multiple Vitamin (MULTIVITAMIN) tablet Take 1 tablet by mouth daily.     olmesartan-hydrochlorothiazide (BENICAR HCT) 40-12.5 MG tablet Take 1 tablet by mouth daily. 90 tablet 3   rosuvastatin (CRESTOR) 5 MG tablet Take 1 tablet (5 mg total) by mouth at bedtime. 90 tablet 3   Semaglutide,0.25 or 0.5MG /DOS, (OZEMPIC, 0.25 OR 0.5 MG/DOSE,) 2 MG/1.5ML SOPN Inject 0.25 mg into the skin once a week. 1.5 mL 1   No current facility-administered medications for this visit.    Allergies-reviewed and updated No Known Allergies  Social History   Socioeconomic History   Marital status: Married    Spouse name: Not on file   Number of children: Not on file   Years of education: Not on file   Highest education level: Not on file  Occupational History   Not on file  Tobacco Use   Smoking status: Former    Types: Cigarettes    Quit date: 2007    Years since quitting: 15.8   Smokeless tobacco: Never  Vaping Use   Vaping Use: Never used  Substance and Sexual Activity   Alcohol use: Yes    Comment: 2-3 beers a month per pt   Drug use: Not Currently   Sexual activity: Yes  Other Topics Concern   Not on file  Social History Narrative   Not on file   Social Determinants of Health    Financial Resource Strain: Not on file  Food Insecurity: Not on file  Transportation Needs: Not on file  Physical Activity: Not on file  Stress: Not on file  Social Connections: Not on file        Objective:  Physical Exam: BP 113/79   Pulse 73   Temp (!) 97.2 F (36.2 C) (Temporal)   Ht 6\' 3"  (1.905 m)   Wt 282 lb 3.2 oz (128 kg)   SpO2 95%   BMI 35.27 kg/m   Body mass index is 35.27 kg/m. Wt Readings from Last 3 Encounters:  05/04/21 282 lb 3.2 oz (128 kg)  09/11/20 272 lb 6.4 oz (123.6  kg)  07/30/20 270 lb (122.5 kg)   Gen: NAD, resting comfortably HEENT: TMs normal bilaterally. OP clear. No thyromegaly noted. Dryness and erythema noted in both ears. CV: RRR with no murmurs appreciated Pulm: NWOB, CTAB with no crackles, wheezes, or rhonchi GI: Normal bowel sounds present. Soft, Nontender, Nondistended. MSK: no edema, cyanosis, or clubbing noted Skin: warm, dry Neuro: CN2-12 grossly intact. Strength 5/5 in upper and lower extremities. Reflexes symmetric and intact bilaterally.  Psych: Normal affect and thought content     I,Jordan Kelly,acting as a scribe for Dimas Chyle, MD.,have documented all relevant documentation on the behalf of Dimas Chyle, MD,as directed by  Dimas Chyle, MD while in the presence of Dimas Chyle, MD.  I, Dimas Chyle, MD, have reviewed all documentation for this visit. The documentation on 05/04/21 for the exam, diagnosis, procedures, and orders are all accurate and complete.  Algis Greenhouse. Jerline Pain, MD 05/04/2021 8:22 AM

## 2021-05-04 NOTE — Assessment & Plan Note (Signed)
At goal on olmesartan-HCTZ 40-12.5 once daily.  Check labs.

## 2021-05-04 NOTE — Assessment & Plan Note (Signed)
Discussed lifestyle modifications.  We will start Ozempic 0.25 mg weekly.  He will follow-up with me in a few weeks via MyChart.  Discussed potential side effects.

## 2021-05-05 NOTE — Progress Notes (Signed)
Please inform patient of the following:  His cholesterol levels are borderline but stable.   His liver numbers are elevated. This is due to fatty liver that was found on ultrasound earlier this year. His numbers are stable.  We should work on weight loss. We started him on ozempic which should help with this.  Do not need to make any other changes to treatment plan at this time.  Would like for him to check in with [place] in a few weeks to let us know how the ozempic is working.   We can recheck everything else in a year.  Cody Hunt. Jerline Pain, MD 05/05/2021 12:36 PM

## 2021-05-06 ENCOUNTER — Telehealth: Payer: Self-pay

## 2021-05-06 NOTE — Telephone Encounter (Signed)
Patient is returning a call about lab results.  

## 2021-05-06 NOTE — Telephone Encounter (Signed)
Returned call to patient. Gave lab results and recommendations. Patient verbalized understanding.

## 2021-05-21 DIAGNOSIS — D225 Melanocytic nevi of trunk: Secondary | ICD-10-CM | POA: Diagnosis not present

## 2021-05-21 DIAGNOSIS — L02821 Furuncle of head [any part, except face]: Secondary | ICD-10-CM | POA: Diagnosis not present

## 2021-05-21 DIAGNOSIS — L814 Other melanin hyperpigmentation: Secondary | ICD-10-CM | POA: Diagnosis not present

## 2021-05-21 DIAGNOSIS — L821 Other seborrheic keratosis: Secondary | ICD-10-CM | POA: Diagnosis not present

## 2021-09-11 ENCOUNTER — Other Ambulatory Visit: Payer: Self-pay | Admitting: Family Medicine

## 2021-10-06 ENCOUNTER — Other Ambulatory Visit: Payer: Self-pay | Admitting: Family Medicine

## 2022-01-05 ENCOUNTER — Telehealth: Payer: Self-pay | Admitting: Family Medicine

## 2022-01-05 NOTE — Telephone Encounter (Signed)
..   Encourage patient to contact the pharmacy for refills or they can request refills through [facility]:  Please schedule appointment if longer than 1 year  05/04/21  NEXT APPOINTMENT DATE:  05/11/22  MEDICATION:clindamycin (CLEOCIN T) 1 % lotion  Is the patient out of medication? 3-4 days left  PHARMACY:  [id number] [place], [place] - [address] Phone:  [phone] Fax:  [phone]     Let patient know to contact pharmacy at the end of the day to make sure medication is ready.  Please notify patient to allow 48-72 hours to process

## 2022-01-07 ENCOUNTER — Other Ambulatory Visit: Payer: Self-pay | Admitting: *Deleted

## 2022-01-07 MED ORDER — CLINDAMYCIN PHOSPHATE 1 % EX LOTN: TOPICAL_LOTION | Freq: Two times a day (BID) | CUTANEOUS | 11 refills | Status: DC | PRN

## 2022-01-07 NOTE — Telephone Encounter (Signed)
[facility]

## 2022-03-08 ENCOUNTER — Encounter: Payer: Self-pay | Admitting: *Deleted

## 2022-05-11 ENCOUNTER — Ambulatory Visit (INDEPENDENT_AMBULATORY_CARE_PROVIDER_SITE_OTHER): Payer: Federal, State, Local not specified - PPO | Admitting: Family Medicine

## 2022-05-11 ENCOUNTER — Encounter: Payer: Self-pay | Admitting: Family Medicine

## 2022-05-11 VITALS — BP 120/85 | HR 68 | Temp 97.7°F | Ht 75.0 in | Wt 267.0 lb

## 2022-05-11 DIAGNOSIS — E785 Hyperlipidemia, unspecified: Secondary | ICD-10-CM

## 2022-05-11 DIAGNOSIS — Z1159 Encounter for screening for other viral diseases: Secondary | ICD-10-CM | POA: Diagnosis not present

## 2022-05-11 DIAGNOSIS — Z131 Encounter for screening for diabetes mellitus: Secondary | ICD-10-CM

## 2022-05-11 DIAGNOSIS — Z0001 Encounter for general adult medical examination with abnormal findings: Secondary | ICD-10-CM

## 2022-05-11 DIAGNOSIS — Z114 Encounter for screening for human immunodeficiency virus [HIV]: Secondary | ICD-10-CM | POA: Diagnosis not present

## 2022-05-11 DIAGNOSIS — F439 Reaction to severe stress, unspecified: Secondary | ICD-10-CM

## 2022-05-11 DIAGNOSIS — I10 Essential (primary) hypertension: Secondary | ICD-10-CM

## 2022-05-11 DIAGNOSIS — Z8249 Family history of ischemic heart disease and other diseases of the circulatory system: Secondary | ICD-10-CM

## 2022-05-11 DIAGNOSIS — K760 Fatty (change of) liver, not elsewhere classified: Secondary | ICD-10-CM | POA: Diagnosis not present

## 2022-05-11 DIAGNOSIS — L739 Follicular disorder, unspecified: Secondary | ICD-10-CM

## 2022-05-11 DIAGNOSIS — J309 Allergic rhinitis, unspecified: Secondary | ICD-10-CM

## 2022-05-11 LAB — COMPREHENSIVE METABOLIC PANEL
ALT: 46 U/L (ref 0–53)
AST: 40 U/L — ABNORMAL HIGH (ref 0–37)
Albumin: 4.8 g/dL (ref 3.5–5.2)
Alkaline Phosphatase: 81 U/L (ref 39–117)
BUN: 12 mg/dL (ref 6–23)
CO2: 29 mEq/L (ref 19–32)
Calcium: 10 mg/dL (ref 8.4–10.5)
Chloride: 104 mEq/L (ref 96–112)
Creatinine, Ser: 0.91 mg/dL (ref 0.40–1.50)
GFR: 97.83 mL/min (ref >60.00)
Glucose, Bld: 81 mg/dL (ref 70–99)
Potassium: 3.9 mEq/L (ref 3.5–5.1)
Sodium: 145 mEq/L (ref 135–145)
Total Bilirubin: 0.8 mg/dL (ref 0.2–1.2)
Total Protein: 7.5 g/dL (ref 6.0–8.3)

## 2022-05-11 LAB — LIPID PANEL
Cholesterol: 172 mg/dL (ref 0–200)
HDL: 32.9 mg/dL — ABNORMAL LOW (ref >39.00)
LDL Cholesterol: 100 mg/dL — ABNORMAL HIGH (ref 0–99)
NonHDL: 139.32
Total CHOL/HDL Ratio: 5
Triglycerides: 198 mg/dL — ABNORMAL HIGH (ref 0.0–149.0)
VLDL: 39.6 mg/dL (ref 0.0–40.0)

## 2022-05-11 LAB — HEMOGLOBIN A1C: Hgb A1c MFr Bld: 5.3 % (ref 4.6–6.5)

## 2022-05-11 LAB — CBC
HCT: 49.9 % (ref 39.0–52.0)
Hemoglobin: 17.5 g/dL — ABNORMAL HIGH (ref 13.0–17.0)
MCHC: 35.1 g/dL (ref 30.0–36.0)
MCV: 93.5 fl (ref 78.0–100.0)
Platelets: 284 10*3/uL (ref 150.0–400.0)
RBC: 5.33 Mil/uL (ref 4.22–5.81)
RDW: 13.8 % (ref 11.5–15.5)
WBC: 8.8 10*3/uL (ref 4.0–10.5)

## 2022-05-11 LAB — TSH: TSH: 2.85 u[IU]/mL (ref 0.35–5.50)

## 2022-05-11 NOTE — Progress Notes (Signed)
Chief Complaint:  Cody Hunt is a 51 y.o. male who presents today for his annual comprehensive physical exam.    Assessment/Plan:  Chronic Problems Addressed Today: Essential hypertension Blood pressure at goal on olmesartan-HCTZ 40-12.5 once daily. Check labs.   Hyperlipidemia Check labs. On Crestor 5 mg daily.  We will check cardiac CT score as well.  Hepatic steatosis He is working on weight loss.  Down about 15 pounds since last year.  Check c-Met.  Morbid obesity ([place]) Congratulated patient on 15 pound weight loss.  He will continue to work on diet and exercise.  Stress He has been under more stress recently but has found strategies to help cope and manage with this.  Feels like symptoms are currently manageable.  We did discuss referral for him to see a therapist however declined for now.  Folliculitis Stable on clindamycin gel as needed.  Does not need refill today.  Allergic rhinitis Uses over-the-counter allergy meds as needed.  Family history of aortic aneurysm Father and brother were both recently diagnosed with thoracic aneurysms.  He is concerned about this as well.  We will be checking cardiac CT scan as above which will hopefully be able to assess for any obvious dilations.   Preventative Healthcare: Check labs.  Up-to-date on vaccines and other screenings.  Patient Counseling(The following topics were reviewed and/or handout was given):  -Nutrition: Stressed importance of moderation in sodium/caffeine intake, saturated fat and cholesterol, caloric balance, sufficient intake of fresh fruits, vegetables, and fiber.  -Stressed the importance of regular exercise.   -Substance Abuse: Discussed cessation/primary prevention of tobacco, alcohol, or other drug use; driving or other dangerous activities under the influence; availability of treatment for abuse.   -Injury prevention: Discussed safety belts, safety helmets, smoke detector, smoking near bedding or  upholstery.   -Sexuality: Discussed sexually transmitted diseases, partner selection, use of condoms, avoidance of unintended pregnancy and contraceptive alternatives.   -Dental health: Discussed importance of regular tooth brushing, flossing, and dental visits.  -Health maintenance and immunizations reviewed. Please refer to Health maintenance section.  Return to care in 1 year for next preventative visit.     Subjective:  HPI:  He has no acute complaints today.   Lifestyle Diet: Balanced. Plenty of fruits and vegetables.  Exercise: Trying to exercise more.      05/11/2022   10:29 AM  Depression screen PHQ 2/9  Decreased Interest 0  Down, Depressed, Hopeless 0  PHQ - 2 Score 0    Health Maintenance Due  Topic Date Due   Hepatitis C Screening  Never done   Zoster Vaccines- Shingrix (2 of 2) 08/29/2021   COVID-19 Vaccine (5 - 2023-24 season) 04/24/2022     ROS: Per HPI, otherwise a complete review of systems was negative.   PMH:  The following were reviewed and entered/updated in epic: Past Medical History:  Diagnosis Date   Hyperlipidemia    Hypertension    Patient Active Problem List   Diagnosis Date Noted   Stress 05/11/2022   Allergic rhinitis 05/11/2022   Family history of aortic aneurysm 05/11/2022   Morbid obesity ([place]) 05/04/2021   Hepatic steatosis 63/87/5643   Folliculitis 32/95/1884   Chronic pain of left thumb 09/11/2020   Essential hypertension 01/04/2020   Hyperlipidemia 01/04/2020   Past Surgical History:  Procedure Laterality Date   EYE SURGERY     skin tag of eye lid   VASECTOMY     5 years ago    Family History  Problem Relation Age of Onset   Skin cancer Mother        non-melanoma   Hypertension Mother    Hypertension Father    Colon polyps Father    Diabetes Mellitus II Brother    Memory loss Maternal Grandmother    Cancer Paternal Grandmother        Was in her 69s. Unsure of what kind -- widespread.   Prostate cancer  Paternal Grandfather        Late in life   Healthy Brother    Healthy Brother    Colon cancer Neg Hx    Esophageal cancer Neg Hx    Rectal cancer Neg Hx    Stomach cancer Neg Hx     Medications- reviewed and updated Current Outpatient Medications  Medication Sig Dispense Refill   clindamycin (CLEOCIN T) 1 % lotion Apply topically 2 (two) times daily as needed. 60 mL 11   FIBER PO Take by mouth.     Multiple Vitamin (MULTIVITAMIN) tablet Take 1 tablet by mouth daily.     olmesartan-hydrochlorothiazide (BENICAR HCT) 40-12.5 MG tablet TAKE 1 TABLET BY MOUTH EVERY DAY 90 tablet 3   rosuvastatin (CRESTOR) 5 MG tablet TAKE 1 TABLET BY MOUTH EVERYDAY AT BEDTIME 90 tablet 3   No current facility-administered medications for this visit.    Allergies-reviewed and updated No Known Allergies  Social History   Socioeconomic History   Marital status: Married    Spouse name: Not on file   Number of children: Not on file   Years of education: Not on file   Highest education level: Not on file  Occupational History   Not on file  Tobacco Use   Smoking status: Former    Types: Cigarettes    Quit date: 2007    Years since quitting: 16.9   Smokeless tobacco: Never  Vaping Use   Vaping Use: Never used  Substance and Sexual Activity   Alcohol use: Yes    Comment: 2-3 beers a month per pt   Drug use: Not Currently   Sexual activity: Yes  Other Topics Concern   Not on file  Social History Narrative   Not on file   Social Determinants of Health   Financial Resource Strain: Not on file  Food Insecurity: Not on file  Transportation Needs: Not on file  Physical Activity: Not on file  Stress: Not on file  Social Connections: Not on file        Objective:  Physical Exam: BP 120/85   Pulse 68   Temp 97.7 F (36.5 C) (Temporal)   Ht _0  (1.905 m)   Wt 267 lb (121.1 kg)   SpO2 98%   BMI 33.37 kg/m   Body mass index is 33.37 kg/m. Wt Readings from Last 3 Encounters:   05/11/22 267 lb (121.1 kg)  05/04/21 282 lb 3.2 oz (128 kg)  09/11/20 272 lb 6.4 oz (123.6 kg)   Gen: NAD, resting comfortably HEENT: TMs normal bilaterally. OP clear. No thyromegaly noted.  CV: RRR with no murmurs appreciated Pulm: NWOB, CTAB with no crackles, wheezes, or rhonchi GI: Normal bowel sounds present. Soft, Nontender, Nondistended. MSK: no edema, cyanosis, or clubbing noted Skin: warm, dry Neuro: CN2-12 grossly intact. Strength 5/5 in upper and lower extremities. Reflexes symmetric and intact bilaterally.  Psych: Normal affect and thought content     Nhyira Leano M. Jerline Pain, MD 05/11/2022 11:07 AM

## 2022-05-11 NOTE — Assessment & Plan Note (Signed)
Uses over-the-counter allergy meds as needed.

## 2022-05-11 NOTE — Assessment & Plan Note (Signed)
He is working on weight loss.  Down about 15 pounds since last year.  Check c-Met.

## 2022-05-11 NOTE — Assessment & Plan Note (Signed)
Blood pressure at goal on olmesartan-HCTZ 40-12.5 once daily. Check labs.

## 2022-05-11 NOTE — Assessment & Plan Note (Signed)
Father and brother were both recently diagnosed with thoracic aneurysms.  He is concerned about this as well.  We will be checking cardiac CT scan as above which will hopefully be able to assess for any obvious dilations.

## 2022-05-11 NOTE — Assessment & Plan Note (Signed)
Stable on clindamycin gel as needed.  Does not need refill today.

## 2022-05-11 NOTE — Assessment & Plan Note (Signed)
Congratulated patient on 15 pound weight loss.  He will continue to work on diet and exercise.

## 2022-05-11 NOTE — Patient Instructions (Signed)
It was very nice to see you today!  We will check blood work today.  We will order a CT scan to look for calcifications in your heart also to make sure that you are not having any dilation of your aorta.  Keep up the good work with your diet and exercise.  We will see you back in a year for your next physical.  Come back sooner if needed.  Take care, Dr Jerline Pain  PLEASE NOTE:  If you had any lab tests please let us know if you have not heard back within a few days. You may see your results on mychart before we have a chance to review them but we will give you a call once they are reviewed by [place]. If we ordered any referrals today, please let us know if you have not heard from their office within the next week.   Please try these tips to maintain a healthy lifestyle:  Eat at least 3 REAL meals and 1-2 snacks per day.  Aim for no more than 5 hours between eating.  If you eat breakfast, please do so within one hour of getting up.   Each meal should contain half fruits/vegetables, one quarter protein, and one quarter carbs (no bigger than a computer mouse)  Cut down on sweet beverages. This includes juice, soda, and sweet tea.   Drink at least 1 glass of water with each meal and aim for at least 8 glasses per day  Exercise at least 150 minutes every week.    Preventive Care 14-32 Years Old, Male Preventive care refers to lifestyle choices and visits with your health care provider that can promote health and wellness. Preventive care visits are also called wellness exams. What can I expect for my preventive care visit? Counseling During your preventive care visit, your health care provider may ask about your: Medical history, including: Past medical problems. Family medical history. Current health, including: Emotional well-being. Home life and relationship well-being. Sexual activity. Lifestyle, including: Alcohol, nicotine or tobacco, and drug use. Access to firearms. Diet,  exercise, and sleep habits. Safety issues such as seatbelt and bike helmet use. Sunscreen use. Work and work Statistician. Physical exam Your health care provider will check your: Height and weight. These may be used to calculate your BMI (body mass index). BMI is a measurement that tells if you are at a healthy weight. Waist circumference. This measures the distance around your waistline. This measurement also tells if you are at a healthy weight and may help predict your risk of certain diseases, such as type 2 diabetes and high blood pressure. Heart rate and blood pressure. Body temperature. Skin for abnormal spots. What immunizations do I need?  Vaccines are usually given at various ages, according to a schedule. Your health care provider will recommend vaccines for you based on your age, medical history, and lifestyle or other factors, such as travel or where you work. What tests do I need? Screening Your health care provider may recommend screening tests for certain conditions. This may include: Lipid and cholesterol levels. Diabetes screening. This is done by checking your blood sugar (glucose) after you have not eaten for a while (fasting). Hepatitis B test. Hepatitis C test. HIV (human immunodeficiency virus) test. STI (sexually transmitted infection) testing, if you are at risk. Lung cancer screening. Prostate cancer screening. Colorectal cancer screening. Talk with your health care provider about your test results, treatment options, and if necessary, the need for more tests. Follow these  instructions at home: Eating and drinking  Eat a diet that includes fresh fruits and vegetables, whole grains, lean protein, and low-fat dairy products. Take vitamin and mineral supplements as recommended by your health care provider. Do not drink alcohol if your health care provider tells you not to drink. If you drink alcohol: Limit how much you have to 0-2 drinks a day. Know how much  alcohol is in your drink. In the [place] one drink equals one 12 oz bottle of beer (355 mL), one 5 oz glass of wine (148 mL), or one 1 oz glass of hard liquor (44 mL). Lifestyle Brush your teeth every morning and night with fluoride toothpaste. Floss one time each day. Exercise for at least 30 minutes 5 or more days each week. Do not use any products that contain nicotine or tobacco. These products include cigarettes, chewing tobacco, and vaping devices, such as e-cigarettes. If you need help quitting, ask your health care provider. Do not use drugs. If you are sexually active, practice safe sex. Use a condom or other form of protection to prevent STIs. Take aspirin only as told by your health care provider. Make sure that you understand how much to take and what form to take. Work with your health care provider to find out whether it is safe and beneficial for you to take aspirin daily. Find healthy ways to manage stress, such as: Meditation, yoga, or listening to music. Journaling. Talking to a trusted person. Spending time with friends and family. Minimize exposure to UV radiation to reduce your risk of skin cancer. Safety Always wear your seat belt while driving or riding in a vehicle. Do not drive: If you have been drinking alcohol. Do not ride with someone who has been drinking. When you are tired or distracted. While texting. If you have been using any mind-altering substances or drugs. Wear a helmet and other protective equipment during sports activities. If you have firearms in your house, make sure you follow all gun safety procedures. What's next? Go to your health care provider once a year for an annual wellness visit. Ask your health care provider how often you should have your eyes and teeth checked. Stay up to date on all vaccines. This information is not intended to replace advice given to you by your health care provider. Make sure you discuss any questions you have with  your health care provider. Document Revised: 11/26/2020 Document Reviewed: 11/26/2020 Elsevier Patient Education  [place].

## 2022-05-11 NOTE — Assessment & Plan Note (Signed)
Check labs. On Crestor 5 mg daily.  We will check cardiac CT score as well.

## 2022-05-11 NOTE — Assessment & Plan Note (Signed)
He has been under more stress recently but has found strategies to help cope and manage with this.  Feels like symptoms are currently manageable.  We did discuss referral for him to see a therapist however declined for now.

## 2022-05-12 LAB — HEPATITIS C ANTIBODY: Hepatitis C Ab: NONREACTIVE

## 2022-05-12 LAB — HIV ANTIBODY (ROUTINE TESTING W REFLEX): HIV 1&2 Ab, 4th Generation: NONREACTIVE

## 2022-05-13 NOTE — Progress Notes (Signed)
Please inform patient of the following:  Labs are all stable.  Cholesterol is better than last year.  Liver numbers are better than last year as well.  Would like for him to keep up the great work with diet and exercise and we can recheck in a year or so.

## 2022-05-19 ENCOUNTER — Ambulatory Visit (HOSPITAL_COMMUNITY)
Admission: RE | Admit: 2022-05-19 | Discharge: 2022-05-19 | Disposition: A | Discharge status: Home / Self Care | Payer: Federal, State, Local not specified - PPO | Source: Ambulatory Visit | Attending: Family Medicine | Admitting: Family Medicine

## 2022-05-19 DIAGNOSIS — I10 Essential (primary) hypertension: Secondary | ICD-10-CM | POA: Insufficient documentation

## 2022-05-19 DIAGNOSIS — Z0001 Encounter for general adult medical examination with abnormal findings: Secondary | ICD-10-CM | POA: Insufficient documentation

## 2022-05-21 NOTE — Progress Notes (Signed)
Please inform patient of the following:  Great news!  His CT scan was normal.  No signs of calcification in his heart.  No abnormalities with his aorta or lungs.  Do not need to make any changes to his treatment plan at this time.  We can repeat in 5 to 10 years.

## 2022-09-05 ENCOUNTER — Other Ambulatory Visit: Payer: Self-pay | Admitting: Family Medicine

## 2022-09-26 ENCOUNTER — Other Ambulatory Visit: Payer: Self-pay | Admitting: Family Medicine

## 2023-03-19 ENCOUNTER — Other Ambulatory Visit: Payer: Self-pay | Admitting: Family Medicine

## 2023-04-15 ENCOUNTER — Other Ambulatory Visit: Payer: Self-pay | Admitting: Family Medicine

## 2023-07-12 ENCOUNTER — Encounter: Payer: Self-pay | Admitting: Family Medicine

## 2023-07-12 ENCOUNTER — Ambulatory Visit (INDEPENDENT_AMBULATORY_CARE_PROVIDER_SITE_OTHER): Payer: Federal, State, Local not specified - PPO | Admitting: Family Medicine

## 2023-07-12 VITALS — BP 109/74 | HR 82 | Temp 97.9°F | Ht 75.0 in | Wt 269.8 lb

## 2023-07-12 DIAGNOSIS — I10 Essential (primary) hypertension: Secondary | ICD-10-CM

## 2023-07-12 DIAGNOSIS — E785 Hyperlipidemia, unspecified: Secondary | ICD-10-CM

## 2023-07-12 DIAGNOSIS — L219 Seborrheic dermatitis, unspecified: Secondary | ICD-10-CM | POA: Diagnosis not present

## 2023-07-12 DIAGNOSIS — Z0001 Encounter for general adult medical examination with abnormal findings: Secondary | ICD-10-CM

## 2023-07-12 DIAGNOSIS — R739 Hyperglycemia, unspecified: Secondary | ICD-10-CM | POA: Diagnosis not present

## 2023-07-12 LAB — TSH: TSH: 3.05 u[IU]/mL (ref 0.35–5.50)

## 2023-07-12 LAB — COMPREHENSIVE METABOLIC PANEL
ALT: 35 U/L (ref 0–53)
AST: 31 U/L (ref 0–37)
Albumin: 4.8 g/dL (ref 3.5–5.2)
Alkaline Phosphatase: 72 U/L (ref 39–117)
BUN: 14 mg/dL (ref 6–23)
CO2: 27 meq/L (ref 19–32)
Calcium: 9.6 mg/dL (ref 8.4–10.5)
Chloride: 104 meq/L (ref 96–112)
Creatinine, Ser: 0.91 mg/dL (ref 0.40–1.50)
GFR: 97.04 mL/min (ref >60.00)
Glucose, Bld: 85 mg/dL (ref 70–99)
Potassium: 3.5 meq/L (ref 3.5–5.1)
Sodium: 141 meq/L (ref 135–145)
Total Bilirubin: 0.8 mg/dL (ref 0.2–1.2)
Total Protein: 7.3 g/dL (ref 6.0–8.3)

## 2023-07-12 LAB — CBC
HCT: 47.9 % (ref 39.0–52.0)
Hemoglobin: 16.6 g/dL (ref 13.0–17.0)
MCHC: 34.7 g/dL (ref 30.0–36.0)
MCV: 93.4 fL (ref 78.0–100.0)
Platelets: 288 10*3/uL (ref 150.0–400.0)
RBC: 5.13 Mil/uL (ref 4.22–5.81)
RDW: 13.6 % (ref 11.5–15.5)
WBC: 5.8 10*3/uL (ref 4.0–10.5)

## 2023-07-12 LAB — LIPID PANEL
Cholesterol: 158 mg/dL (ref 0–200)
HDL: 34.6 mg/dL — ABNORMAL LOW (ref >39.00)
LDL Cholesterol: 86 mg/dL (ref 0–99)
NonHDL: 123.72
Total CHOL/HDL Ratio: 5
Triglycerides: 189 mg/dL — ABNORMAL HIGH (ref 0.0–149.0)
VLDL: 37.8 mg/dL (ref 0.0–40.0)

## 2023-07-12 LAB — HEMOGLOBIN A1C: Hgb A1c MFr Bld: 5.4 % (ref 4.6–6.5)

## 2023-07-12 MED ORDER — ZEPBOUND 2.5 MG/0.5ML ~~LOC~~ SOAJ: 2.5000 mg | SUBCUTANEOUS | 0 refills | Status: DC

## 2023-07-12 MED ORDER — HYDROCORTISONE-ACETIC ACID 1-2 % OT SOLN: 5.0000 [drp] | Freq: Two times a day (BID) | OTIC | 0 refills | Status: AC

## 2023-07-12 MED ORDER — ACETIC ACID 2 % OT SOLN: 6.0000 [drp] | OTIC | 0 refills | Status: AC

## 2023-07-12 MED ORDER — CLINDAMYCIN PHOSPHATE 1 % EX LOTN: TOPICAL_LOTION | Freq: Two times a day (BID) | CUTANEOUS | 11 refills | Status: AC | PRN

## 2023-07-12 NOTE — Progress Notes (Signed)
Chief Complaint:  Cody Hunt is a 53 y.o. male who presents today for his annual comprehensive physical exam.    Assessment/Plan:  Chronic Problems Addressed Today: Essential hypertension Blood pressure at goal on olmesartan hydrochlorothiazide 40-12.5 once daily.   Morbid obesity (HCC) BMI 33.7 with comorbidities.  We did discuss lifestyle modifications.  He is interested in starting GLP-1 agonist.  Will send in Zepbound 2.5 mg weekly.  We discussed potential side effects.  He will follow-up with [place] in a few weeks via MyChart.  Seborrheic dermatitis Mild flare in is left ear.  Will refill his acetic acid hydrocortisone drops.  Hyperlipidemia Check lipids.   Preventative Healthcare: Check labs. Due for colonoscopy in 2029. Up to date on vaccines.   Patient Counseling(The following topics were reviewed and/or handout was given):  -Nutrition: Stressed importance of moderation in sodium/caffeine intake, saturated fat and cholesterol, caloric balance, sufficient intake of fresh fruits, vegetables, and fiber.  -Stressed the importance of regular exercise.   -Substance Abuse: Discussed cessation/primary prevention of tobacco, alcohol, or other drug use; driving or other dangerous activities under the influence; availability of treatment for abuse.   -Injury prevention: Discussed safety belts, safety helmets, smoke detector, smoking near bedding or upholstery.   -Sexuality: Discussed sexually transmitted diseases, partner selection, use of condoms, avoidance of unintended pregnancy and contraceptive alternatives.   -Dental health: Discussed importance of regular tooth brushing, flossing, and dental visits.  -Health maintenance and immunizations reviewed. Please refer to Health maintenance section.  Return to care in 1 year for next preventative visit.     Subjective:  HPI:  He has no acute complaints today. See Assessment / plan for status of chronic  conditions.  Lifestyle Diet: Cutting down on sugar and carbohydrates. Trying to limit portion sizes.  Exercise: Limited due to knee pain but trying to get in steps.      07/12/2023    9:20 AM  Depression screen PHQ 2/9  Decreased Interest 0  Down, Depressed, Hopeless 0  PHQ - 2 Score 0    There are no preventive care reminders to display for this patient.   ROS: Per HPI, otherwise a complete review of systems was negative.   PMH:  The following were reviewed and entered/updated in epic: Past Medical History:  Diagnosis Date   Hyperlipidemia    Hypertension    Patient Active Problem List   Diagnosis Date Noted   Seborrheic dermatitis 07/12/2023   Stress 05/11/2022   Allergic rhinitis 05/11/2022   Family history of aortic aneurysm 05/11/2022   Morbid obesity (HCC) 05/04/2021   Hepatic steatosis 09/11/2020   Folliculitis 09/11/2020   Chronic pain of left thumb 09/11/2020   Essential hypertension 01/04/2020   Hyperlipidemia 01/04/2020   Past Surgical History:  Procedure Laterality Date   EYE SURGERY     skin tag of eye lid   VASECTOMY     5 years ago    Family History  Problem Relation Age of Onset   Skin cancer Mother        non-melanoma   Hypertension Mother    Hypertension Father    Colon polyps Father    Diabetes Mellitus II Brother    Memory loss Maternal Grandmother    Cancer Paternal Grandmother        Was in her 74s. Unsure of what kind -- widespread.   Prostate cancer Paternal Grandfather        Late in life   Healthy Brother    Healthy  Brother    Colon cancer Neg Hx    Esophageal cancer Neg Hx    Rectal cancer Neg Hx    Stomach cancer Neg Hx     Medications- reviewed and updated Current Outpatient Medications  Medication Sig Dispense Refill   acetic acid 2 % otic solution Place 6 drops into both ears every 3 (three) hours. 15 mL 0   acetic acid-hydrocortisone (VOSOL-HC) OTIC solution Place 5 drops into both ears 2 (two) times daily. 10 mL  0   FIBER PO Take by mouth.     Multiple Vitamin (MULTIVITAMIN) tablet Take 1 tablet by mouth daily.     olmesartan-hydrochlorothiazide (BENICAR HCT) 40-12.5 MG tablet TAKE 1 TABLET BY MOUTH EVERY DAY 90 tablet 0   rosuvastatin (CRESTOR) 5 MG tablet TAKE 1 TABLET BY MOUTH EVERYDAY AT BEDTIME 90 tablet 3   tirzepatide (ZEPBOUND) 2.5 MG/0.5ML Pen Inject 2.5 mg into the skin once a week. 2 mL 0   clindamycin (CLEOCIN T) 1 % lotion Apply topically 2 (two) times daily as needed. 60 mL 11   No current facility-administered medications for this visit.    Allergies-reviewed and updated No Known Allergies  Social History   Socioeconomic History   Marital status: Married    Spouse name: Not on file   Number of children: Not on file   Years of education: Not on file   Highest education level: Not on file  Occupational History   Not on file  Tobacco Use   Smoking status: Former    Current packs/day: 0.00    Types: Cigarettes    Quit date: 2007    Years since quitting: 18.0   Smokeless tobacco: Never  Vaping Use   Vaping status: Never Used  Substance and Sexual Activity   Alcohol use: Yes    Comment: 2-3 beers a month per pt   Drug use: Not Currently   Sexual activity: Yes  Other Topics Concern   Not on file  Social History Narrative   Not on file   Social Drivers of Health   Financial Resource Strain: Not on file  Food Insecurity: Not on file  Transportation Needs: Not on file  Physical Activity: Not on file  Stress: Not on file  Social Connections: Not on file        Objective:  Physical Exam: BP 109/74   Pulse 82   Temp 97.9 F (36.6 C) (Temporal)   Ht 6\' 3"  (1.905 m)   Wt 269 lb 12.8 oz (122.4 kg)   SpO2 94%   BMI 33.72 kg/m   Body mass index is 33.72 kg/m. Wt Readings from Last 3 Encounters:  07/12/23 269 lb 12.8 oz (122.4 kg)  05/11/22 267 lb (121.1 kg)  05/04/21 282 lb 3.2 oz (128 kg)   Gen: NAD, resting comfortably HEENT: TMs normal bilaterally. OP  clear. No thyromegaly noted.  Left EAC irritated with seborrheic dermatitis. CV: RRR with no murmurs appreciated Pulm: NWOB, CTAB with no crackles, wheezes, or rhonchi GI: Normal bowel sounds present. Soft, Nontender, Nondistended. MSK: no edema, cyanosis, or clubbing noted Skin: warm, dry Neuro: CN2-12 grossly intact. Strength 5/5 in upper and lower extremities. Reflexes symmetric and intact bilaterally.  Psych: Normal affect and thought content     Mayre Bury M. Jimmey Ralph, MD 07/12/2023 9:54 AM

## 2023-07-12 NOTE — Assessment & Plan Note (Signed)
Check lipids

## 2023-07-12 NOTE — Assessment & Plan Note (Signed)
Mild flare in is left ear.  Will refill his acetic acid hydrocortisone drops.

## 2023-07-12 NOTE — Assessment & Plan Note (Signed)
BMI 33.7 with comorbidities.  We did discuss lifestyle modifications.  He is interested in starting GLP-1 agonist.  Will send in Zepbound 2.5 mg weekly.  We discussed potential side effects.  He will follow-up with [place] in a few weeks via MyChart.

## 2023-07-12 NOTE — Patient Instructions (Addendum)
It was very nice to see you today!  We will blood work.  I will refill your eardrops and clindamycin lotion.  We will start Zepbound.  Please send me a message in a few weeks to let me know how this is working and we can adjust the dose as needed.  Please continue to work on diet and exercise.  I will see you back in a year.  Please come back sooner if needed.  Return in about 1 year (around 07/11/2024) for Annual Physical.   Take care, Dr Jimmey Ralph  PLEASE NOTE:  If you had any lab tests, please let us know if you have not heard back within a few days. You may see your results on mychart before we have a chance to review them but we will give you a call once they are reviewed by [place].   If we ordered any referrals today, please let us know if you have not heard from their office within the next week.   If you had any urgent prescriptions sent in today, please check with the pharmacy within an hour of our visit to make sure the prescription was transmitted appropriately.   Please try these tips to maintain a healthy lifestyle:  Eat at least 3 REAL meals and 1-2 snacks per day.  Aim for no more than 5 hours between eating.  If you eat breakfast, please do so within one hour of getting up.   Each meal should contain half fruits/vegetables, one quarter protein, and one quarter carbs (no bigger than a computer mouse)  Cut down on sweet beverages. This includes juice, soda, and sweet tea.   Drink at least 1 glass of water with each meal and aim for at least 8 glasses per day  Exercise at least 150 minutes every week.    Preventive Care 59-33 Years Old, Male Preventive care refers to lifestyle choices and visits with your health care provider that can promote health and wellness. Preventive care visits are also called wellness exams. What can I expect for my preventive care visit? Counseling During your preventive care visit, your health care provider may ask about your: Medical history,  including: Past medical problems. Family medical history. Current health, including: Emotional well-being. Home life and relationship well-being. Sexual activity. Lifestyle, including: Alcohol, nicotine or tobacco, and drug use. Access to firearms. Diet, exercise, and sleep habits. Safety issues such as seatbelt and bike helmet use. Sunscreen use. Work and work Astronomer. Physical exam Your health care provider will check your: Height and weight. These may be used to calculate your BMI (body mass index). BMI is a measurement that tells if you are at a healthy weight. Waist circumference. This measures the distance around your waistline. This measurement also tells if you are at a healthy weight and may help predict your risk of certain diseases, such as type 2 diabetes and high blood pressure. Heart rate and blood pressure. Body temperature. Skin for abnormal spots. What immunizations do I need?  Vaccines are usually given at various ages, according to a schedule. Your health care provider will recommend vaccines for you based on your age, medical history, and lifestyle or other factors, such as travel or where you work. What tests do I need? Screening Your health care provider may recommend screening tests for certain conditions. This may include: Lipid and cholesterol levels. Diabetes screening. This is done by checking your blood sugar (glucose) after you have not eaten for a while (fasting). Hepatitis B test.  Hepatitis C test. HIV (human immunodeficiency virus) test. STI (sexually transmitted infection) testing, if you are at risk. Lung cancer screening. Prostate cancer screening. Colorectal cancer screening. Talk with your health care provider about your test results, treatment options, and if necessary, the need for more tests. Follow these instructions at home: Eating and drinking  Eat a diet that includes fresh fruits and vegetables, whole grains, lean protein, and  low-fat dairy products. Take vitamin and mineral supplements as recommended by your health care provider. Do not drink alcohol if your health care provider tells you not to drink. If you drink alcohol: Limit how much you have to 0-2 drinks a day. Know how much alcohol is in your drink. In the [place] one drink equals one 12 oz bottle of beer (355 mL), one 5 oz glass of wine (148 mL), or one 1 oz glass of hard liquor (44 mL). Lifestyle Brush your teeth every morning and night with fluoride toothpaste. Floss one time each day. Exercise for at least 30 minutes 5 or more days each week. Do not use any products that contain nicotine or tobacco. These products include cigarettes, chewing tobacco, and vaping devices, such as e-cigarettes. If you need help quitting, ask your health care provider. Do not use drugs. If you are sexually active, practice safe sex. Use a condom or other form of protection to prevent STIs. Take aspirin only as told by your health care provider. Make sure that you understand how much to take and what form to take. Work with your health care provider to find out whether it is safe and beneficial for you to take aspirin daily. Find healthy ways to manage stress, such as: Meditation, yoga, or listening to music. Journaling. Talking to a trusted person. Spending time with friends and family. Minimize exposure to UV radiation to reduce your risk of skin cancer. Safety Always wear your seat belt while driving or riding in a vehicle. Do not drive: If you have been drinking alcohol. Do not ride with someone who has been drinking. When you are tired or distracted. While texting. If you have been using any mind-altering substances or drugs. Wear a helmet and other protective equipment during sports activities. If you have firearms in your house, make sure you follow all gun safety procedures. What's next? Go to your health care provider once a year for an annual wellness  visit. Ask your health care provider how often you should have your eyes and teeth checked. Stay up to date on all vaccines. This information is not intended to replace advice given to you by your health care provider. Make sure you discuss any questions you have with your health care provider. Document Revised: 11/26/2020 Document Reviewed: 11/26/2020 Elsevier Patient Education  2024 [employer].

## 2023-07-12 NOTE — Assessment & Plan Note (Signed)
Blood pressure at goal on olmesartan hydrochlorothiazide 40-12.5 once daily.

## 2023-07-13 ENCOUNTER — Encounter: Payer: Self-pay | Admitting: Family Medicine

## 2023-07-13 NOTE — Progress Notes (Signed)
Labs are all stable.  Cholesterol is better than last time we checked.  He should keep up the great work with diet and exercise and we can recheck again in a year.

## 2023-07-15 ENCOUNTER — Other Ambulatory Visit (HOSPITAL_COMMUNITY): Payer: Self-pay

## 2023-07-15 ENCOUNTER — Telehealth: Payer: Self-pay | Admitting: Pharmacist

## 2023-07-15 NOTE — Telephone Encounter (Signed)
Pharmacy Patient Advocate Encounter   Received notification from Physician's Office that prior authorization for Zepbound 2.5MG /0.5ML pen-injectors is required/requested.   Insurance verification completed.   The patient is insured through [facility] .   Per test claim: PA required; PA submitted to above mentioned insurance via CoverMyMeds Key/confirmation #/EOC [facility] Status is pending

## 2023-07-17 ENCOUNTER — Other Ambulatory Visit: Payer: Self-pay | Admitting: Family Medicine

## 2023-07-18 NOTE — Telephone Encounter (Signed)
Pharmacy Patient Advocate Encounter  Received notification from [facility] that Prior Authorization for Zepbound 2.5MG /0.5ML pen-injectors has been DENIED.  See denial reason below. No denial letter attached in CMM. Will attach denial letter to Media tab once received.   PA #/Case ID/Reference #:  [id number]

## 2023-07-19 ENCOUNTER — Other Ambulatory Visit: Payer: Self-pay | Admitting: Family Medicine

## 2023-07-19 NOTE — Telephone Encounter (Signed)
 Left message to return call to our office at their convenience.

## 2023-07-22 ENCOUNTER — Other Ambulatory Visit (HOSPITAL_COMMUNITY): Payer: Self-pay

## 2023-07-26 NOTE — Telephone Encounter (Signed)
Patient notified Rx Zepbound not approved by insurance  Suggested to give them a call for alternatives  Verbalized understanding

## 2023-09-01 ENCOUNTER — Other Ambulatory Visit: Payer: Self-pay | Admitting: Family Medicine

## 2023-09-27 ENCOUNTER — Other Ambulatory Visit (HOSPITAL_COMMUNITY): Payer: Self-pay

## 2023-10-14 ENCOUNTER — Other Ambulatory Visit: Payer: Self-pay | Admitting: Family Medicine

## 2023-11-09 ENCOUNTER — Telehealth: Payer: Self-pay

## 2023-11-09 ENCOUNTER — Other Ambulatory Visit (HOSPITAL_COMMUNITY): Payer: Self-pay

## 2023-11-09 NOTE — Telephone Encounter (Signed)
 Pharmacy Patient Advocate Encounter   Received notification from CoverMyMeds that prior authorization for Zepbound  2.5 is required/requested.   Insurance verification completed.   The patient is insured through [facility] .   Per test claim: PA required; PA submitted to above mentioned insurance via CoverMyMeds Key/confirmation #/EOC [id number] Status is pending

## 2023-11-10 NOTE — Telephone Encounter (Signed)
 Patient notified Rx Zebound denied by insurance

## 2023-11-10 NOTE — Telephone Encounter (Signed)
 Pharmacy Patient Advocate Encounter  Received notification from [facility] that Prior Authorization for Zepbound  has been DENIED.  Full denial letter will be uploaded to the media tab. See denial reason below.    PA #/Case ID/Reference #: [id number]

## 2024-01-13 ENCOUNTER — Other Ambulatory Visit: Payer: Self-pay | Admitting: Family Medicine

## 2024-03-09 ENCOUNTER — Ambulatory Visit: Admitting: Family Medicine

## 2024-03-09 VITALS — BP 138/88 | HR 78 | Temp 98.4°F | Ht 75.0 in | Wt 267.8 lb

## 2024-03-09 DIAGNOSIS — R1013 Epigastric pain: Secondary | ICD-10-CM

## 2024-03-09 DIAGNOSIS — K760 Fatty (change of) liver, not elsewhere classified: Secondary | ICD-10-CM | POA: Diagnosis not present

## 2024-03-09 DIAGNOSIS — I10 Essential (primary) hypertension: Secondary | ICD-10-CM | POA: Diagnosis not present

## 2024-03-09 LAB — COMPREHENSIVE METABOLIC PANEL WITH GFR
ALT: 26 U/L (ref 0–53)
AST: 27 U/L (ref 0–37)
Albumin: 4.9 g/dL (ref 3.5–5.2)
Alkaline Phosphatase: 78 U/L (ref 39–117)
BUN: 9 mg/dL (ref 6–23)
CO2: 32 meq/L (ref 19–32)
Calcium: 10.1 mg/dL (ref 8.4–10.5)
Chloride: 104 meq/L (ref 96–112)
Creatinine, Ser: 1.01 mg/dL (ref 0.40–1.50)
GFR: 85.23 mL/min (ref >60.00)
Glucose, Bld: 97 mg/dL (ref 70–99)
Potassium: 4.2 meq/L (ref 3.5–5.1)
Sodium: 144 meq/L (ref 135–145)
Total Bilirubin: 0.6 mg/dL (ref 0.2–1.2)
Total Protein: 7.2 g/dL (ref 6.0–8.3)

## 2024-03-09 LAB — CBC
HCT: 47.1 % (ref 39.0–52.0)
Hemoglobin: 16.4 g/dL (ref 13.0–17.0)
MCHC: 34.7 g/dL (ref 30.0–36.0)
MCV: 91.7 fl (ref 78.0–100.0)
Platelets: 285 K/uL (ref 150.0–400.0)
RBC: 5.14 Mil/uL (ref 4.22–5.81)
RDW: 13.6 % (ref 11.5–15.5)
WBC: 9.5 K/uL (ref 4.0–10.5)

## 2024-03-09 LAB — LIPASE: Lipase: 36 U/L (ref 11.0–59.0)

## 2024-03-09 MED ORDER — PANTOPRAZOLE SODIUM 40 MG PO TBEC: 40.0000 mg | DELAYED_RELEASE_TABLET | Freq: Every day | ORAL | 0 refills | Status: DC

## 2024-03-09 NOTE — Assessment & Plan Note (Signed)
 He is working on weight loss.  May benefit from GLP at some point the future that we can discuss this at his next office visit.  Checking LFTs with labs today.

## 2024-03-09 NOTE — Assessment & Plan Note (Signed)
 Insurance not approve Zepbound .  He is working on lifestyle interventions.  Down about 2 pounds since last time he was here

## 2024-03-09 NOTE — Patient Instructions (Signed)
 It was very nice to see you today!  VISIT SUMMARY: You came in today because of digestive issues that get worse after eating. We discussed your symptoms, including pain, nausea, and diarrhea, and reviewed your history of fatty liver.  YOUR PLAN: RIGHT UPPER ABDOMINAL PAIN WITH ASSOCIATED NAUSEA, DIARRHEA, AND ABDOMINAL DISTENSION: You have intermittent pain with nausea, diarrhea, and bloating, which gets worse after eating. We are considering a gastric ulcer or H. pylori infection as possible causes. -Take Protonix  (pantoprazole ) once daily for 2 weeks. -We will do an H. pylori breath test and blood work to check your gallbladder, liver, and pancreas function. -Follow up with us  once we have the test results so we can adjust your treatment plan. -If your symptoms persist, we may refer you to a gastroenterologist for an endoscopy.  FATTY LIVER DISEASE: We discussed your fatty liver condition and potential future treatments. -We talked about the possibility of using [facility] for treatment in the future, depending on your insurance coverage.  Return if symptoms worsen or fail to improve.   Take care, Dr Kennyth  PLEASE NOTE:  If you had any lab tests, please let us  know if you have not heard back within a few days. You may see your results on mychart before we have a chance to review them but we will give you a call once they are reviewed by us .   If we ordered any referrals today, please let us  know if you have not heard from their office within the next week.   If you had any urgent prescriptions sent in today, please check with the pharmacy within an hour of our visit to make sure the prescription was transmitted appropriately.   Please try these tips to maintain a healthy lifestyle:  Eat at least 3 REAL meals and 1-2 snacks per day.  Aim for no more than 5 hours between eating.  If you eat breakfast, please do so within one hour of getting up.   Each meal should contain half  fruits/vegetables, one quarter protein, and one quarter carbs (no bigger than a computer mouse)  Cut down on sweet beverages. This includes juice, soda, and sweet tea.   Drink at least 1 glass of water with each meal and aim for at least 8 glasses per day  Exercise at least 150 minutes every week.

## 2024-03-09 NOTE — Progress Notes (Signed)
 Cody Hunt is a 54 y.o. male who presents today for an office visit.  Assessment/Plan:  New/Acute Problems: Epigastric Abdominal Pain  Overall reassuring exam.  History is consistent with biliary colic however had an ultrasound a few years ago which did not show ultrasound.  We discussed alternative potential etiologies including PUD and H. pylori.  We will check labs including CBC, c-Met, lipase, H. pylori breath test.  Will start Protonix  40 mg daily while we await this workup.  Given his ultrasound from a few years ago was negative do not think we need to repeat this today.  We did discuss referral to GI for further evaluation however he would like to try above first before pursuing this.  He will follow-up with us  in a few weeks.  Chronic Problems Addressed Today: Essential hypertension Blood pressure at goal today on olmesartan  HCTZ 40-12.5 once daily.   Hepatic steatosis He is working on weight loss.  May benefit from GLP at some point the future that we can discuss this at his next office visit.  Checking LFTs with labs today.  Morbid obesity (HCC) Insurance not approve Zepbound .  He is working on lifestyle interventions.  Down about 2 pounds since last time he was here     Subjective:  HPI:  See assessment / plan for status of chronic conditions.   Discussed the use of AI scribe software for clinical note transcription with the patient, who gave verbal consent to proceed.  History of Present Illness Cody Hunt is a 53 year old male with fatty liver who presents with digestive issues related to eating.  He experiences digestive issues exacerbated by eating, with pain starting high and sometimes moving low, always on the right side, and becoming intense. The pain leads to nausea and sometimes gagging, with relief occurring when he is able to move air in either direction. The symptoms resolve quickly once they subside.  These symptoms have occurred intermittently  since his youth, with a recent resurgence about a month ago. Eating is identified as a trigger, with symptoms sometimes appearing within an hour after eating. He has been taking a probiotic for the past week, which seems to have reduced the severity of his symptoms, though he still experiences some discomfort and bouts of diarrhea.  He recalls similar symptoms in his twenties, which improved after reducing stress and quitting smoking. He has a history of fatty liver, and an ultrasound a few years ago showed no gallstones.  Coffee sometimes alleviates discomfort, though it can also lead to diarrhea. Milk exacerbates gas, so he has been avoiding it, though he still consumes other dairy products like pizza. He experiences alternating constipation and diarrhea, with recent episodes of dry heaving but no vomiting. He recalls being on medication for GERD about ten years ago, which resolved after he stopped drinking beer.         Objective:  Physical Exam: BP 138/88   Pulse 78   Temp 98.4 F (36.9 C) (Temporal)   Ht 6' 3 (1.905 m)   Wt 267 lb 12.8 oz (121.5 kg)   SpO2 98%   BMI 33.47 kg/m   Wt Readings from Last 3 Encounters:  03/09/24 267 lb 12.8 oz (121.5 kg)  07/12/23 269 lb 12.8 oz (122.4 kg)  05/11/22 267 lb (121.1 kg)    Gen: No acute distress, resting comfortably CV: Regular rate and rhythm with no murmurs appreciated Pulm: Normal work of breathing, clear to auscultation bilaterally with no crackles, wheezes,  or rhonchi Abdomen: Soft, nontender, nondistended.  No rebound or guarding. Neuro: Grossly normal, moves all extremities Psych: Normal affect and thought content      Cody Peedin M. Kennyth, MD 03/09/2024 1:45 PM

## 2024-03-09 NOTE — Assessment & Plan Note (Signed)
Blood pressure at goal today on olmesartan-HCTZ 40-12.5 once daily.  

## 2024-03-12 LAB — H. PYLORI BREATH TEST: H. pylori Breath Test: NOT DETECTED

## 2024-03-13 ENCOUNTER — Ambulatory Visit: Payer: Self-pay | Admitting: Family Medicine

## 2024-03-13 NOTE — Progress Notes (Signed)
 Labs are all normal.  Do not need to make any changes to treatment plan.  He should let us  know if his symptoms are not improving with the Protonix  over the next couple of weeks.

## 2024-03-28 ENCOUNTER — Emergency Department (HOSPITAL_BASED_OUTPATIENT_CLINIC_OR_DEPARTMENT_OTHER)

## 2024-03-28 ENCOUNTER — Emergency Department (HOSPITAL_BASED_OUTPATIENT_CLINIC_OR_DEPARTMENT_OTHER)
Admission: EM | Admit: 2024-03-28 | Discharge: 2024-03-28 | Disposition: A | Discharge status: Home / Self Care | Attending: Emergency Medicine | Admitting: Emergency Medicine

## 2024-03-28 ENCOUNTER — Encounter (HOSPITAL_BASED_OUTPATIENT_CLINIC_OR_DEPARTMENT_OTHER): Payer: Self-pay

## 2024-03-28 ENCOUNTER — Other Ambulatory Visit: Payer: Self-pay

## 2024-03-28 DIAGNOSIS — I10 Essential (primary) hypertension: Secondary | ICD-10-CM | POA: Insufficient documentation

## 2024-03-28 DIAGNOSIS — K828 Other specified diseases of gallbladder: Secondary | ICD-10-CM | POA: Insufficient documentation

## 2024-03-28 DIAGNOSIS — R1011 Right upper quadrant pain: Secondary | ICD-10-CM

## 2024-03-28 DIAGNOSIS — Z79899 Other long term (current) drug therapy: Secondary | ICD-10-CM | POA: Diagnosis not present

## 2024-03-28 DIAGNOSIS — K4090 Unilateral inguinal hernia, without obstruction or gangrene, not specified as recurrent: Secondary | ICD-10-CM | POA: Diagnosis not present

## 2024-03-28 DIAGNOSIS — R109 Unspecified abdominal pain: Secondary | ICD-10-CM | POA: Diagnosis not present

## 2024-03-28 LAB — COMPREHENSIVE METABOLIC PANEL WITH GFR
ALT: 37 U/L (ref 0–44)
AST: 36 U/L (ref 15–41)
Albumin: 5 g/dL (ref 3.5–5.0)
Alkaline Phosphatase: 87 U/L (ref 38–126)
Anion gap: 13 (ref 5–15)
BUN: 11 mg/dL (ref 6–20)
CO2: 26 mmol/L (ref 22–32)
Calcium: 10 mg/dL (ref 8.9–10.3)
Chloride: 103 mmol/L (ref 98–111)
Creatinine, Ser: 1.09 mg/dL (ref 0.61–1.24)
GFR, Estimated: >60 mL/min (ref >60)
Glucose, Bld: 116 mg/dL — ABNORMAL HIGH (ref 70–99)
Potassium: 3.2 mmol/L — ABNORMAL LOW (ref 3.5–5.1)
Sodium: 142 mmol/L (ref 135–145)
Total Bilirubin: 0.6 mg/dL (ref 0.0–1.2)
Total Protein: 7.8 g/dL (ref 6.5–8.1)

## 2024-03-28 LAB — CBC
HCT: 47 % (ref 39.0–52.0)
Hemoglobin: 16.7 g/dL (ref 13.0–17.0)
MCH: 31.8 pg (ref 26.0–34.0)
MCHC: 35.5 g/dL (ref 30.0–36.0)
MCV: 89.5 fL (ref 80.0–100.0)
Platelets: 307 K/uL (ref 150–400)
RBC: 5.25 MIL/uL (ref 4.22–5.81)
RDW: 13.2 % (ref 11.5–15.5)
WBC: 10.3 K/uL (ref 4.0–10.5)
nRBC: 0 % (ref 0.0–0.2)

## 2024-03-28 LAB — LIPASE, BLOOD: Lipase: 36 U/L (ref 11–51)

## 2024-03-28 MED ORDER — IOHEXOL 300 MG/ML  SOLN
100.0000 mL | Freq: Once | INTRAMUSCULAR | Status: AC | PRN
  Administered 2024-03-28: 100 mL via INTRAVENOUS

## 2024-03-28 MED ORDER — HYDROMORPHONE HCL 1 MG/ML IJ SOLN
1.0000 mg | Freq: Once | INTRAMUSCULAR | Status: AC
  Administered 2024-03-28: 1 mg via INTRAVENOUS
  Filled 2024-03-28: qty 1

## 2024-03-28 MED ORDER — MORPHINE SULFATE (PF) 4 MG/ML IV SOLN
4.0000 mg | Freq: Once | INTRAVENOUS | Status: AC
  Administered 2024-03-28: 4 mg via INTRAVENOUS
  Filled 2024-03-28: qty 1

## 2024-03-28 MED ORDER — ONDANSETRON HCL 4 MG/2ML IJ SOLN
4.0000 mg | Freq: Once | INTRAMUSCULAR | Status: AC
  Administered 2024-03-28: 4 mg via INTRAVENOUS
  Filled 2024-03-28: qty 2

## 2024-03-28 MED ORDER — OXYCODONE-ACETAMINOPHEN 5-325 MG PO TABS: 1.0000 | ORAL_TABLET | Freq: Four times a day (QID) | ORAL | 0 refills | Status: AC | PRN

## 2024-03-28 MED ORDER — SODIUM CHLORIDE 0.9 % IV BOLUS
1000.0000 mL | Freq: Once | INTRAVENOUS | Status: AC
  Administered 2024-03-28: 1000 mL via INTRAVENOUS

## 2024-03-28 NOTE — Discharge Instructions (Signed)
 You were seen today for abdominal pain.  While your workup today is reassuring, I have high suspicion that it is likely related to your gallbladder.  While you do not have gallstones, the gallbladder wall was thickened.  Follow-up with general surgery for evaluation and possible additional testing.  In the meantime, avoid fatty or greasy meals.  If symptoms return, you will be provided with a short course of medications.  Return to the emergency room for fevers, worsening pain, any new or worsening symptoms.

## 2024-03-28 NOTE — ED Triage Notes (Signed)
 Pt POV reporting intermittent R side abd pain x1 month, worsened tonight, seen by PCP< taking protonix  without improvement.

## 2024-03-28 NOTE — ED Provider Notes (Signed)
 [name] Provider Note   CSN: [id number] Arrival date & time: 03/28/24  0002     Patient presents with: Abdominal Pain   Cody Hunt is a 53 y.o. male.   HPI     This is a 53 year old male who presents with abdominal pain.  Patient reports onset of symptoms for prior to arrival.  It was after eating.  He reports sharp nonradiating right upper quadrant pain.  He has had similar symptoms in recent weeks that have come and gone which seem to be related to eating.  He was treated with Protonix  by his primary doctor thought he was getting better.  Has had some nausea and vomiting.  No change in bowels.  No fevers.  Prior to Admission medications   Medication Sig Start Date End Date Taking? Authorizing Provider  oxyCODONE-acetaminophen (PERCOCET/ROXICET) 5-325 MG tablet Take 1 tablet by mouth every 6 (six) hours as needed for severe pain (pain score 7-10). 03/28/24  Yes Candelario Steppe, Charmaine FALCON, MD  acetic acid  2 % otic solution Place 6 drops into both ears every 3 (three) hours. 07/12/23   Kennyth Worth HERO, MD  acetic acid -hydrocortisone  (VOSOL -HC) OTIC solution Place 5 drops into both ears 2 (two) times daily. 07/12/23   Kennyth Worth HERO, MD  clindamycin  (CLEOCIN  T) 1 % lotion Apply topically 2 (two) times daily as needed. 07/12/23   Parker, Caleb M, MD  FIBER PO Take by mouth.    [provider]  Multiple Vitamin (MULTIVITAMIN) tablet Take 1 tablet by mouth daily.    [provider]  olmesartan -hydrochlorothiazide (BENICAR  HCT) 40-12.5 MG tablet TAKE 1 TABLET BY MOUTH EVERY DAY 01/13/24   Kennyth Worth HERO, MD  pantoprazole  (PROTONIX ) 40 MG tablet Take 1 tablet (40 mg total) by mouth daily. 03/09/24   Kennyth Worth HERO, MD  rosuvastatin  (CRESTOR ) 5 MG tablet TAKE 1 TABLET BY MOUTH EVERYDAY AT BEDTIME 09/01/23   Kennyth Worth HERO, MD    Allergies: Patient has no known allergies.    Review of Systems  Constitutional:  Negative for fever.   Respiratory:  Negative for shortness of breath.   Cardiovascular:  Negative for chest pain.  Gastrointestinal:  Positive for abdominal pain, nausea and vomiting.  All other systems reviewed and are negative.   Updated Vital Signs BP 128/85   Pulse 69   Temp 97.8 F (36.6 C) (Oral)   Resp 18   Ht 1.905 m (6' 3)   Wt 117.9 kg   SpO2 95%   BMI 32.50 kg/m   Physical Exam Vitals and nursing note reviewed.  Constitutional:      Appearance: He is well-developed.  HENT:     Head: Normocephalic and atraumatic.  Eyes:     Pupils: Pupils are equal, round, and reactive to light.  Cardiovascular:     Rate and Rhythm: Normal rate and regular rhythm.     Heart sounds: Normal heart sounds. No murmur heard. Pulmonary:     Effort: Pulmonary effort is normal. No respiratory distress.     Breath sounds: Normal breath sounds. No wheezing.  Abdominal:     General: Bowel sounds are normal.     Palpations: Abdomen is soft.     Tenderness: There is abdominal tenderness in the right upper quadrant. There is no guarding or rebound. Positive signs include Murphy's sign.  Musculoskeletal:     Cervical back: Neck supple.  Lymphadenopathy:     Cervical: No cervical adenopathy.  Skin:  General: Skin is warm and dry.  Neurological:     Mental Status: He is alert and oriented to person, place, and time.  Psychiatric:        Mood and Affect: Mood normal.     (all labs ordered are listed, but only abnormal results are displayed) Labs Reviewed  COMPREHENSIVE METABOLIC PANEL WITH GFR - Abnormal; Notable for the following components:      Result Value   Potassium 3.2 (*)    Glucose, Bld 116 (*)    All other components within normal limits  LIPASE, BLOOD  CBC  URINALYSIS, ROUTINE W REFLEX MICROSCOPIC    EKG: None  Radiology: US  Abdomen Limited RUQ (LIVER/GB) Result Date: 03/28/2024 CLINICAL DATA:  Right upper quadrant pain EXAM: ULTRASOUND ABDOMEN LIMITED RIGHT UPPER QUADRANT  COMPARISON:  CT scan earlier same day FINDINGS: Gallbladder: Gallbladder wall thickness measures up to 5 mm. No gallstones evident by ultrasound. No pericholecystic fluid. The sonographer reports no sonographic Murphy sign. Common bile duct: Diameter: 6 mm Liver: No focal lesion identified. Portal vein is patent on color Doppler imaging with normal direction of blood flow towards the liver. Other: None. IMPRESSION: Abnormal gallbladder wall thickening without evidence for gallstones or pericholecystic fluid. Close clinical correlation for signs/symptoms of acute cholecystitis recommended. Electronically Signed   By: Camellia Candle M.D.   On: 03/28/2024 05:45   CT ABDOMEN PELVIS W CONTRAST Result Date: 03/28/2024 EXAM: CT ABDOMEN AND PELVIS WITH CONTRAST 03/28/2024 02:23:08 AM TECHNIQUE: CT of the abdomen and pelvis was performed with the administration of 100 mL of iohexol (OMNIPAQUE) 300 MG/ML solution. Multiplanar reformatted images are provided for review. Automated exposure control, iterative reconstruction, and/or weight-based adjustment of the mA/kV was utilized to reduce the radiation dose to as low as reasonably achievable. COMPARISON: None available. CLINICAL HISTORY: Abdominal pain, acute, nonlocalized. Pt POV reporting intermittent R side abd pain x1 month, worsened tonight, seen by PCP, taking protonix  without improvement. FINDINGS: LOWER CHEST: Mild scarring/atelectasis in the bilateral lower lobes. LIVER: The liver is unremarkable. GALLBLADDER AND BILE DUCTS: Gallbladder is unremarkable. No biliary ductal dilatation. SPLEEN: No acute abnormality. PANCREAS: No acute abnormality. ADRENAL GLANDS: No acute abnormality. KIDNEYS, URETERS AND BLADDER: No stones in the kidneys or ureters. No hydronephrosis. No perinephric or periureteral stranding. Urinary bladder is unremarkable. GI AND BOWEL: Stomach demonstrates no acute abnormality. There is no bowel obstruction. Normal appendix (image 61). PERITONEUM  AND RETROPERITONEUM: No ascites. No free air. VASCULATURE: Aorta is normal in caliber. LYMPH NODES: No lymphadenopathy. REPRODUCTIVE ORGANS: Prostate is unremarkable. BONES AND SOFT TISSUES: Mild degenerative changes at L5-S1. Tiny fat-containing left inguinal hernia. No acute osseous abnormality. No focal soft tissue abnormality. IMPRESSION: 1. No acute findings in the abdomen or pelvis. Electronically signed by: Pinkie Pebbles MD 03/28/2024 02:33 AM EDT RP Workstation: [id number]     Procedures   Medications Ordered in the ED  morphine (PF) 4 MG/ML injection 4 mg (4 mg Intravenous Given 03/28/24 0117)  ondansetron (ZOFRAN) injection 4 mg (4 mg Intravenous Given 03/28/24 0117)  sodium chloride  0.9 % bolus 1,000 mL (0 mLs Intravenous Stopped 03/28/24 0408)  HYDROmorphone (DILAUDID) injection 1 mg (1 mg Intravenous Given 03/28/24 0149)  iohexol (OMNIPAQUE) 300 MG/ML solution 100 mL (100 mLs Intravenous Contrast Given 03/28/24 0216)                                    Medical Decision Making Amount  and/or Complexity of Data Reviewed Labs: ordered. Radiology: ordered.  Risk Prescription drug management.   This patient presents to the ED for concern of abdominal pain, this involves an extensive number of treatment options, and is a complaint that carries with it a high risk of complications and morbidity.  I considered the following differential and admission for this acute, potentially life threatening condition.  The differential diagnosis includes cholecystitis, biliary colic, gastritis, gastroenteritis, pancreatitis, peptic ulcer disease  MDM:    This is a 53 year old male who presents with right upper quadrant abdominal pain.  It is postprandial.  He is nontoxic and vital signs are reassuring.  Has tenderness to palpation without signs of peritonitis.  History is suggestive of likely gallbladder pathology.  Labs obtained.  No leukocytosis.  Normal LFTs.  Normal lipase.  CT imaging  obtained as this was what was available at the time.  CT imaging does not show any significant findings.  Subsequent ultrasound imaging shows gallbladder wall thickening without evidence of gallstones.  Patient could have biliary dyskinesis.  He did improve clinically.  Discussed with him surgical follow-up and in the meantime avoiding fatty or greasy meals or meals that exacerbate his symptoms.  Will send home with a short course of pain medication if symptoms return.  He was given strict return precautions.  (Labs, imaging, consults)  Labs: I Ordered, and personally interpreted labs.  The pertinent results include: CBC, CMP, lipase  Imaging Studies ordered: I ordered imaging studies including CT, ultrasound I independently visualized and interpreted imaging. I agree with the radiologist interpretation  Additional history obtained from chart review.  External records from outside source obtained and reviewed including prior evaluations  Cardiac Monitoring: The patient was maintained on a cardiac monitor.  If on the cardiac monitor, I personally viewed and interpreted the cardiac monitored which showed an underlying rhythm of: Sinus  Reevaluation: After the interventions noted above, I reevaluated the patient and found that they have :improved  Social Determinants of Health:  lives independently  Disposition: Discharge  Co morbidities that complicate the patient evaluation  Past Medical History:  Diagnosis Date   Hyperlipidemia    Hypertension      Medicines Meds ordered this encounter  Medications   morphine (PF) 4 MG/ML injection 4 mg   ondansetron (ZOFRAN) injection 4 mg   sodium chloride  0.9 % bolus 1,000 mL   HYDROmorphone (DILAUDID) injection 1 mg   iohexol (OMNIPAQUE) 300 MG/ML solution 100 mL   oxyCODONE-acetaminophen (PERCOCET/ROXICET) 5-325 MG tablet    Sig: Take 1 tablet by mouth every 6 (six) hours as needed for severe pain (pain score 7-10).    Dispense:  10  tablet    Refill:  0    I have reviewed the patients home medicines and have made adjustments as needed  Problem List / ED Course: Problem List Items Addressed This Visit   None Visit Diagnoses       Colicky RUQ abdominal pain    -  Primary     Thickening of wall of gallbladder                    Final diagnoses:  Colicky RUQ abdominal pain  Thickening of wall of gallbladder    ED Discharge Orders          Ordered    oxyCODONE-acetaminophen (PERCOCET/ROXICET) 5-325 MG tablet  Every 6 hours PRN        03/28/24 0629  Bari Charmaine FALCON, MD 03/28/24 [phone]

## 2024-04-01 ENCOUNTER — Other Ambulatory Visit: Payer: Self-pay | Admitting: Family Medicine

## 2024-04-03 ENCOUNTER — Other Ambulatory Visit (HOSPITAL_COMMUNITY): Payer: Self-pay | Admitting: Surgery

## 2024-04-03 DIAGNOSIS — R1319 Other dysphagia: Secondary | ICD-10-CM | POA: Diagnosis not present

## 2024-04-03 DIAGNOSIS — R112 Nausea with vomiting, unspecified: Secondary | ICD-10-CM | POA: Diagnosis not present

## 2024-04-03 DIAGNOSIS — R1084 Generalized abdominal pain: Secondary | ICD-10-CM | POA: Diagnosis not present

## 2024-04-03 DIAGNOSIS — R1010 Upper abdominal pain, unspecified: Secondary | ICD-10-CM

## 2024-04-03 DIAGNOSIS — K760 Fatty (change of) liver, not elsewhere classified: Secondary | ICD-10-CM

## 2024-04-03 DIAGNOSIS — Z860101 Personal history of adenomatous and serrated colon polyps: Secondary | ICD-10-CM

## 2024-04-18 ENCOUNTER — Encounter (HOSPITAL_COMMUNITY): Payer: Self-pay

## 2024-04-18 ENCOUNTER — Ambulatory Visit (HOSPITAL_COMMUNITY)

## 2024-06-15 ENCOUNTER — Telehealth: Payer: Self-pay | Admitting: Family Medicine

## 2024-06-15 NOTE — Telephone Encounter (Signed)
 FYI:  Called pt from PT schedules- LVM: PAT. called back and got transferred to the front office Pat. states that they would like to keep this appointment scheduled on MyChart for 06/27/2024, Patient claims to have same symptoms (stomach pain/Same abdominal issues as last visit) and they have been occurring since 03/09/24, they said that they haven't worsened and have stayed around the same. Offered sooner appointment and patient declined due to being out of town and that is the earliest they will be available, Advised to speak with an triage nurse just incase and in addition advised go to urgent or emergency room if anything changes. Patient stated the same, see the pcp and are okay to wait till when they scheduled the appointment to follow up.

## 2024-06-18 NOTE — Telephone Encounter (Signed)
 noted

## 2024-06-20 ENCOUNTER — Other Ambulatory Visit: Payer: Self-pay | Admitting: Medical Genetics

## 2024-06-27 ENCOUNTER — Ambulatory Visit: Admitting: Family Medicine

## 2024-06-27 VITALS — BP 110/88 | HR 65 | Temp 98.1°F | Ht 75.0 in | Wt 251.6 lb

## 2024-06-27 DIAGNOSIS — R1011 Right upper quadrant pain: Secondary | ICD-10-CM | POA: Diagnosis not present

## 2024-06-27 DIAGNOSIS — I10 Essential (primary) hypertension: Secondary | ICD-10-CM | POA: Diagnosis not present

## 2024-06-27 DIAGNOSIS — K760 Fatty (change of) liver, not elsewhere classified: Secondary | ICD-10-CM | POA: Diagnosis not present

## 2024-06-27 DIAGNOSIS — R739 Hyperglycemia, unspecified: Secondary | ICD-10-CM

## 2024-06-27 DIAGNOSIS — Z23 Encounter for immunization: Secondary | ICD-10-CM | POA: Diagnosis not present

## 2024-06-27 DIAGNOSIS — E785 Hyperlipidemia, unspecified: Secondary | ICD-10-CM

## 2024-06-27 LAB — COMPREHENSIVE METABOLIC PANEL WITH GFR
ALT: 26 U/L (ref 3–53)
AST: 25 U/L (ref 5–37)
Albumin: 4.7 g/dL (ref 3.5–5.2)
Alkaline Phosphatase: 67 U/L (ref 39–117)
BUN: 11 mg/dL (ref 6–23)
CO2: 29 meq/L (ref 19–32)
Calcium: 9.6 mg/dL (ref 8.4–10.5)
Chloride: 105 meq/L (ref 96–112)
Creatinine, Ser: 0.86 mg/dL (ref 0.40–1.50)
GFR: 99.02 mL/min
Glucose, Bld: 78 mg/dL (ref 70–99)
Potassium: 4 meq/L (ref 3.5–5.1)
Sodium: 142 meq/L (ref 135–145)
Total Bilirubin: 0.8 mg/dL (ref 0.2–1.2)
Total Protein: 7.1 g/dL (ref 6.0–8.3)

## 2024-06-27 LAB — CBC
HCT: 46.4 % (ref 39.0–52.0)
Hemoglobin: 16.1 g/dL (ref 13.0–17.0)
MCHC: 34.6 g/dL (ref 30.0–36.0)
MCV: 92.7 fl (ref 78.0–100.0)
Platelets: 257 K/uL (ref 150.0–400.0)
RBC: 5 Mil/uL (ref 4.22–5.81)
RDW: 13.4 % (ref 11.5–15.5)
WBC: 5.8 K/uL (ref 4.0–10.5)

## 2024-06-27 LAB — LIPID PANEL
Cholesterol: 196 mg/dL (ref 28–200)
HDL: 33.6 mg/dL — ABNORMAL LOW
LDL Cholesterol: 122 mg/dL — ABNORMAL HIGH (ref 10–99)
NonHDL: 161.92
Total CHOL/HDL Ratio: 6
Triglycerides: 201 mg/dL — ABNORMAL HIGH (ref 10.0–149.0)
VLDL: 40.2 mg/dL — ABNORMAL HIGH (ref 0.0–40.0)

## 2024-06-27 LAB — GAMMA GT: GGT: 24 U/L (ref 7–51)

## 2024-06-27 LAB — LIPASE: Lipase: 27 U/L (ref 11.0–59.0)

## 2024-06-27 LAB — TSH: TSH: 3.01 u[IU]/mL (ref 0.35–5.50)

## 2024-06-27 LAB — HEMOGLOBIN A1C: Hgb A1c MFr Bld: 5 % (ref 4.6–6.5)

## 2024-06-27 NOTE — Progress Notes (Signed)
 "  Cody Hunt is a 54 y.o. male who presents today for an office visit.  Assessment/Plan:  New/Acute Problems: RUQ Pain  Patient with persistent and intermittent right upper quadrant pain for the last several months.  We saw him for this several months ago with concern for biliary colic though did note that his recent ultrasound did not show any gallstones.  Since our last visit he was seen in the emergency room and had an ultrasound performed which did show gallbladder wall thickening however no gallstones.  Subsequently saw general surgery who recommended HIDA scan to rule out biliary dyskinesis.  He did not proceed with this due to cost concerns.  Discussed with patient that it would be a good idea to pursue this at this point to further evaluate however he does note that symptoms have improved since stopping statin a few weeks ago.  Discussed with patient that this is not a typical side effect of the statin however it would be reasonable for him to continue to monitor for another few weeks off of the statin.  He has been making dietary changes and lifestyle modifications which has been beneficial.  If he has any recurrence he will go forward with HIDA scan though may consider referral to GI depending on results of his HIDA scan.  We discussed reasons to return to care.  We are checking labs today including c-Met, CBC, lipase, and GGT.  Chronic Problems Addressed Today: Hyperlipidemia Check lipids with labs today.  Patient does note that his right upper quadrant pain has resolved since stopping his Crestor  a few weeks ago.  As above did note with patient that this is not a expected side effect of the Crestor  however he will stay off this for a few more weeks to see if he has any recurrence.  May consider trial of low-dose or low intensity statin the future such as simvastatin depending on labs and progression of the above symptoms.  Essential hypertension Blood sugar at goal today on olmesartan   HCTZ 40-12.5 once daily.  Hepatic steatosis Check labs.   Tdap given today.    Subjective:  HPI:  See assessment / plan for status of chronic conditions.    Discussed the use of AI scribe software for clinical note transcription with the patient, who gave verbal consent to proceed.  History of Present Illness The patient presents with recurrent abdominal pain and a history of gallbladder issues.  He experiences recurrent episodes of abdominal pain, with a significant episode leading to an emergency room visit. During this visit, various scans were performed, but no definitive cause was identified. He was referred to a surgeon who suggested further testing, but due to financial constraints during a government shutdown, he declined the additional test.  He manages his symptoms by monitoring his diet, although this has not significantly alleviated the pain. He reports having four episodes since the emergency room visit, with only one being intense. He was prescribed hydrocodone for pain management and has used it sparingly, with four out of ten pills remaining.  Previously, he was placed on an acid blocker, which initially improved symptoms for two to three weeks. However, after consuming tacos from a food truck, he experienced severe pain, leading to another emergency room visit. A CT scan and ultrasound were performed, showing no gallstones but indicating a thickened gallbladder.  He recalls a discussion about gallstones and was informed about the possibility of biliary colic or dyskinesia. A HIDA scan was recommended to confirm gallbladder  dysfunction, but the cost was prohibitive. He has since stopped taking a statin medication, which he believes has reduced his symptoms significantly, with only mild discomfort occurring occasionally after meals.  He continues to experience diarrhea, which he attributes to dietary choices, including fruit smoothies, coffee, and probiotics. He has  adjusted his diet to see if symptoms improve.  Current medications include hydrocodone as needed for pain, and he has discontinued the statin. He continues to use blood pressure medication and a topical cream for his face and head.         Objective:  Physical Exam: BP 110/88   Pulse 65   Temp 98.1 F (36.7 C) (Temporal)   Ht 6' 3 (1.905 m)   Wt 251 lb 9.6 oz (114.1 kg)   SpO2 96%   BMI 31.45 kg/m   Gen: No acute distress, resting comfortably CV: Regular rate and rhythm with no murmurs appreciated Pulm: Normal work of breathing, clear to auscultation bilaterally with no crackles, wheezes, or rhonchi Neuro: Grossly normal, moves all extremities Psych: Normal affect and thought content  Time Spent: 40 minutes of total time was spent on the date of the encounter performing the following actions: chart review prior to seeing the patient including recent ED visit and visit with surgery, obtaining history, performing a medically necessary exam, counseling on the treatment plan, placing orders, and documenting in our EHR.        Worth HERO. Kennyth, MD 06/27/2024 9:43 AM  "

## 2024-06-27 NOTE — Assessment & Plan Note (Signed)
 Blood sugar at goal today on olmesartan  HCTZ 40-12.5 once daily.

## 2024-06-27 NOTE — Patient Instructions (Signed)
 It was very nice to see you today!  VISIT SUMMARY: You visited us  today due to recurrent abdominal pain and a history of gallbladder issues. We discussed your symptoms, previous tests, and potential next steps for managing your condition.  YOUR PLAN: SUSPECTED GALLBLADDER DYSKINESIA: Your intermittent abdominal pain may be linked to gallbladder dyskinesia. Previous scans showed gallbladder thickening, and symptoms improved after stopping your statin medication. -Monitor your symptoms for a few weeks after stopping the statin. -Consider a HIDA scan if symptoms persist or recur to assess gallbladder function. -If the HIDA scan confirms dyskinesia, we may discuss gallbladder removal. -If the HIDA scan is negative, we may refer you to a gastroenterologist for an upper endoscopy.  HYPERLIPIDEMIA: You stopped taking your high-intensity statin due to suspected symptom exacerbation. There is no immediate cardiovascular risk from short-term discontinuation. -Monitor your lipid levels and symptoms after stopping the statin. -We may consider reintroducing a milder statin if needed in the future.  ESSENTIAL HYPERTENSION: Your blood pressure remains well-controlled despite recent routine disruptions. -Continue your current antihypertensive medication regimen.  GENERAL HEALTH MAINTENANCE: You are due for annual blood work and a tetanus vaccination. -We have ordered your annual blood work. -You received your tetanus vaccination today.  Return in about 1 year (around 06/27/2025) for Annual Physical.   Take care, Dr Kennyth  PLEASE NOTE:  If you had any lab tests, please let us  know if you have not heard back within a few days. You may see your results on mychart before we have a chance to review them but we will give you a call once they are reviewed by us .   If we ordered any referrals today, please let us  know if you have not heard from their office within the next week.   If you had any urgent  prescriptions sent in today, please check with the pharmacy within an hour of our visit to make sure the prescription was transmitted appropriately.   Please try these tips to maintain a healthy lifestyle:  Eat at least 3 REAL meals and 1-2 snacks per day.  Aim for no more than 5 hours between eating.  If you eat breakfast, please do so within one hour of getting up.   Each meal should contain half fruits/vegetables, one quarter protein, and one quarter carbs (no bigger than a computer mouse)  Cut down on sweet beverages. This includes juice, soda, and sweet tea.   Drink at least 1 glass of water with each meal and aim for at least 8 glasses per day  Exercise at least 150 minutes every week.    Preventive Care 54-29 Years Old, Male Preventive care refers to lifestyle choices and visits with your health care provider that can promote health and wellness. Preventive care visits are also called wellness exams. What can I expect for my preventive care visit? Counseling During your preventive care visit, your health care provider may ask about your: Medical history, including: Past medical problems. Family medical history. Current health, including: Emotional well-being. Home life and relationship well-being. Sexual activity. Lifestyle, including: Alcohol, nicotine or tobacco, and drug use. Access to firearms. Diet, exercise, and sleep habits. Safety issues such as seatbelt and bike helmet use. Sunscreen use. Work and work astronomer. Physical exam Your health care provider will check your: Height and weight. These may be used to calculate your BMI (body mass index). BMI is a measurement that tells if you are at a healthy weight. Waist circumference. This measures the distance around  your waistline. This measurement also tells if you are at a healthy weight and may help predict your risk of certain diseases, such as type 2 diabetes and high blood pressure. Heart rate and blood  pressure. Body temperature. Skin for abnormal spots. What immunizations do I need?  Vaccines are usually given at various ages, according to a schedule. Your health care provider will recommend vaccines for you based on your age, medical history, and lifestyle or other factors, such as travel or where you work. What tests do I need? Screening Your health care provider may recommend screening tests for certain conditions. This may include: Lipid and cholesterol levels. Diabetes screening. This is done by checking your blood sugar (glucose) after you have not eaten for a while (fasting). Hepatitis B test. Hepatitis C test. HIV (human immunodeficiency virus) test. STI (sexually transmitted infection) testing, if you are at risk. Lung cancer screening. Prostate cancer screening. Colorectal cancer screening. Talk with your health care provider about your test results, treatment options, and if necessary, the need for more tests. Follow these instructions at home: Eating and drinking  Eat a diet that includes fresh fruits and vegetables, whole grains, lean protein, and low-fat dairy products. Take vitamin and mineral supplements as recommended by your health care provider. Do not drink alcohol if your health care provider tells you not to drink. If you drink alcohol: Limit how much you have to 0-2 drinks a day. Know how much alcohol is in your drink. In the [place] one drink equals one 12 oz bottle of beer (355 mL), one 5 oz glass of wine (148 mL), or one 1 oz glass of hard liquor (44 mL). Lifestyle Brush your teeth every morning and night with fluoride toothpaste. Floss one time each day. Exercise for at least 30 minutes 5 or more days each week. Do not use any products that contain nicotine or tobacco. These products include cigarettes, chewing tobacco, and vaping devices, such as e-cigarettes. If you need help quitting, ask your health care provider. Do not use drugs. If you are sexually  active, practice safe sex. Use a condom or other form of protection to prevent STIs. Take aspirin only as told by your health care provider. Make sure that you understand how much to take and what form to take. Work with your health care provider to find out whether it is safe and beneficial for you to take aspirin daily. Find healthy ways to manage stress, such as: Meditation, yoga, or listening to music. Journaling. Talking to a trusted person. Spending time with friends and family. Minimize exposure to UV radiation to reduce your risk of skin cancer. Safety Always wear your seat belt while driving or riding in a vehicle. Do not drive: If you have been drinking alcohol. Do not ride with someone who has been drinking. When you are tired or distracted. While texting. If you have been using any mind-altering substances or drugs. Wear a helmet and other protective equipment during sports activities. If you have firearms in your house, make sure you follow all gun safety procedures. What's next? Go to your health care provider once a year for an annual wellness visit. Ask your health care provider how often you should have your eyes and teeth checked. Stay up to date on all vaccines. This information is not intended to replace advice given to you by your health care provider. Make sure you discuss any questions you have with your health care provider. Document Revised: 11/26/2020 Document Reviewed:  11/26/2020 Elsevier Patient Education  2024 [employer].

## 2024-06-27 NOTE — Assessment & Plan Note (Signed)
 Check lipids with labs today.  Patient does note that his right upper quadrant pain has resolved since stopping his Crestor  a few weeks ago.  As above did note with patient that this is not a expected side effect of the Crestor  however he will stay off this for a few more weeks to see if he has any recurrence.  May consider trial of low-dose or low intensity statin the future such as simvastatin depending on labs and progression of the above symptoms.

## 2024-06-27 NOTE — Assessment & Plan Note (Signed)
 Check labs

## 2024-06-29 ENCOUNTER — Ambulatory Visit: Payer: Self-pay | Admitting: Family Medicine

## 2024-06-29 NOTE — Progress Notes (Signed)
 His LDL is up a little bit since we last checked.  This is probably due to him stopping the Crestor .  All of his other labs are at goal.  No obvious explanation for his symptom based on the labs.  Recommend he follow-up with us  in a few weeks to let us  know how his symptoms are progressing since stopping the Crestor .  We can recheck all of his labs in a year.

## 2024-07-11 ENCOUNTER — Other Ambulatory Visit

## 2024-07-11 DIAGNOSIS — Z006 Encounter for examination for normal comparison and control in clinical research program: Secondary | ICD-10-CM

## 2024-07-11 LAB — GENECONNECT MOLECULAR SCREEN

## 2025-06-28 ENCOUNTER — Encounter: Admitting: Family Medicine
